# Patient Record
Sex: Female | Born: 1980 | Race: Asian | Hispanic: No | Marital: Married | Smoking: Never smoker
Health system: Southern US, Community
[De-identification: ages and names within clinical notes are randomized; demographics above are authoritative.]

## PROBLEM LIST (undated history)

## (undated) DIAGNOSIS — E0590 Thyrotoxicosis, unspecified without thyrotoxic crisis or storm: Secondary | ICD-10-CM

## (undated) DIAGNOSIS — E079 Disorder of thyroid, unspecified: Secondary | ICD-10-CM

## (undated) DIAGNOSIS — D219 Benign neoplasm of connective and other soft tissue, unspecified: Secondary | ICD-10-CM

## (undated) HISTORY — DX: Benign neoplasm of connective and other soft tissue, unspecified: D21.9

## (undated) HISTORY — PX: PLACEMENT OF BREAST IMPLANTS: SHX6334

## (undated) HISTORY — DX: Disorder of thyroid, unspecified: E07.9

---

## 2017-05-23 ENCOUNTER — Encounter: Payer: Self-pay | Admitting: Advanced Practice Midwife

## 2017-05-23 ENCOUNTER — Ambulatory Visit (INDEPENDENT_AMBULATORY_CARE_PROVIDER_SITE_OTHER): Payer: 59 | Admitting: Advanced Practice Midwife

## 2017-05-23 VITALS — BP 92/64 | HR 90 | Wt 123.0 lb

## 2017-05-23 DIAGNOSIS — Z113 Encounter for screening for infections with a predominantly sexual mode of transmission: Secondary | ICD-10-CM

## 2017-05-23 DIAGNOSIS — Z124 Encounter for screening for malignant neoplasm of cervix: Secondary | ICD-10-CM

## 2017-05-23 DIAGNOSIS — Z87898 Personal history of other specified conditions: Secondary | ICD-10-CM

## 2017-05-23 DIAGNOSIS — Z3A19 19 weeks gestation of pregnancy: Secondary | ICD-10-CM

## 2017-05-23 DIAGNOSIS — Z3400 Encounter for supervision of normal first pregnancy, unspecified trimester: Secondary | ICD-10-CM | POA: Insufficient documentation

## 2017-05-23 DIAGNOSIS — O99282 Endocrine, nutritional and metabolic diseases complicating pregnancy, second trimester: Secondary | ICD-10-CM

## 2017-05-23 DIAGNOSIS — E0590 Thyrotoxicosis, unspecified without thyrotoxic crisis or storm: Secondary | ICD-10-CM

## 2017-05-23 DIAGNOSIS — Z789 Other specified health status: Secondary | ICD-10-CM | POA: Insufficient documentation

## 2017-05-23 NOTE — Patient Instructions (Signed)

## 2017-05-23 NOTE — Addendum Note (Signed)
Addended by: Tarry Kos on: 05/23/2017 12:02 PM   Modules accepted: Orders

## 2017-05-23 NOTE — Addendum Note (Signed)
Addended by: Asencion Islam on: 05/23/2017 11:55 AM   Modules accepted: Orders

## 2017-05-23 NOTE — Progress Notes (Signed)
   PRENATAL VISIT NOTE  Subjective:  Cartha Rotert is a 36 y.o. G1P0 at [redacted]w[redacted]d by LMP being seen today for initial prenatal visit.  She is currently monitored for the following issues for this high-risk pregnancy and has Supervision of normal first pregnancy, antepartum; Hyperthyroidism affecting pregnancy in second trimester; and Language barrier affecting health care on her problem list.  Patient reports no complaints.   .  .   . Denies leaking of fluid.   The following portions of the patient's history were reviewed and updated as appropriate: allergies, current medications, past family history, past medical history, past social history, past surgical history and problem list. Problem list updated.  Objective:   Vitals:   05/23/17 0849  BP: 92/64  Pulse: 90  Weight: 123 lb (55.8 kg)    Fetal Status:           VS reviewed, nursing note reviewed,  Constitutional: well developed, well nourished, no distress HEENT: normocephalic CV: normal rate Pulm/chest wall: normal effort Breast Exam:  right breast normal, implant palpable, without mass, skin or nipple changes or axillary nodes, left breast normal, implant palpable, without mass, skin or nipple changes or axillary nodes Abdomen: soft Neuro: alert and oriented x 3 Skin: warm, dry Psych: affect normal Pelvic exam: Cervix pink, visually closed, without lesion, scant white creamy discharge, vaginal walls and external genitalia normal SVE: Cervix 0/Ambrosino/high, firm, posterior  Assessment and Plan:  Pregnancy: G1P0 at [redacted]w[redacted]d  1. Supervision of normal first pregnancy, antepartum  - HIV antibody (with reflex) - TSH - Cystic fibrosis diagnostic study - Sickle Cell Scr - Urine cytology ancillary only - Prenatal (OB Panel) - CULTURE, URINE COMPREHENSIVE - US OB DETAIL + 61 WK; Future  2. [redacted] weeks gestation of pregnancy --Anatomy US ordered at appropriate gestation - US OB DETAIL + 14 WK; Future  3. Hyperthyroidism affecting  pregnancy in second trimester --Pt reports hx of hyperthyroid but not on meds.  Discussed management of hyperthyroid in pregnancy including antenatal testing and IOL at 39 weeks. --TSH ordered today  4. History of lump of left breast --Pt reports breast mass seen incidentally on X-ray and recommendation was to follow up in 6 months.   --Breast exam wnl and pt thinks it was left side but unsure --Confirm which side and consider breast US at [facility] for follow up  5. Language barrier affecting health care --Mandarin video interpreter used for medical history taking, interpreter listened to most conversation in [place] and answered any questions for provider or patient as needed.  Preterm labor symptoms and general obstetric precautions including but not limited to vaginal bleeding, contractions, leaking of fluid and fetal movement were reviewed in detail with the patient. Please refer to After Visit Summary for other counseling recommendations.  Return in about 4 weeks (around 06/20/2017).   Fatima Blank, CNM

## 2017-05-24 LAB — AFP, QUAD SCREEN
AFP: 39.5 ng/mL
Age Alone: 1
CURR GEST AGE: 16 wk
Down Syndrome Scr Risk Est: 1
HCG TOTAL: 82.78 [IU]/mL
INH: 331 pg/mL
MATERNAL WT: 122 [lb_av]
MOM FOR INH: 1.65
MoM for AFP: 1.07
MoM for hCG: 1.88
Osb Risk: <1
Trisomy 18 (Edward) Syndrome Interp.: <1
Twins-AFP: 1
UE3 MOM: 0.94
UE3 VALUE: 0.79 ng/mL

## 2017-05-25 LAB — CULTURE, URINE COMPREHENSIVE
MICRO NUMBER:: 81025118
SPECIMEN QUALITY:: ADEQUATE

## 2017-05-25 LAB — CYTOLOGY - PAP
CHLAMYDIA, DNA PROBE: NEGATIVE
DIAGNOSIS: NEGATIVE
HPV (WINDOPATH): NOT DETECTED
NEISSERIA GONORRHEA: NEGATIVE

## 2017-05-26 ENCOUNTER — Other Ambulatory Visit: Payer: Self-pay | Admitting: Advanced Practice Midwife

## 2017-05-26 DIAGNOSIS — O99891 Other specified diseases and conditions complicating pregnancy: Secondary | ICD-10-CM

## 2017-05-26 DIAGNOSIS — R8271 Bacteriuria: Secondary | ICD-10-CM

## 2017-05-26 DIAGNOSIS — O9989 Other specified diseases and conditions complicating pregnancy, childbirth and the puerperium: Principal | ICD-10-CM

## 2017-05-26 MED ORDER — CEPHALEXIN 500 MG PO CAPS: 500.0000 mg | ORAL_CAPSULE | Freq: Four times a day (QID) | ORAL | 0 refills | Status: DC

## 2017-05-26 NOTE — Progress Notes (Signed)
Rx for Keflex 500 mg QID x 7 days for asymptomatic bacteruria in pregnancy

## 2017-05-31 LAB — OBSTETRIC PANEL
Antibody Screen: NOT DETECTED
BASOS ABS: 17 {cells}/uL (ref 0–200)
Basophils Relative: 0.2 %
EOS ABS: 87 {cells}/uL (ref 15–500)
Eosinophils Relative: 1 %
HCT: 38.8 % (ref 35.0–45.0)
HEP B S AG: NONREACTIVE
Hemoglobin: 13 g/dL (ref 11.7–15.5)
LYMPHS ABS: 1784 {cells}/uL (ref 850–3900)
MCH: 31.4 pg (ref 27.0–33.0)
MCHC: 33.5 g/dL (ref 32.0–36.0)
MCV: 93.7 fL (ref 80.0–100.0)
MPV: 10.1 fL (ref 7.5–12.5)
Monocytes Relative: 6.8 %
NEUTROS PCT: 71.5 %
Neutro Abs: 6221 cells/uL (ref 1500–7800)
PLATELETS: 218 10*3/uL (ref 140–400)
RBC: 4.14 10*6/uL (ref 3.80–5.10)
RDW: 12.2 % (ref 11.0–15.0)
RPR: NONREACTIVE
RUBELLA: 10.6 {index}
Total Lymphocyte: 20.5 %
WBC: 8.7 10*3/uL (ref 3.8–10.8)
WBCMIX: 592 {cells}/uL (ref 200–950)

## 2017-05-31 LAB — TSH: TSH: 0.73 m[IU]/L

## 2017-05-31 LAB — SICKLE CELL SCREEN: SICKLE SOLUBILITY TEST - HGBRFX: NEGATIVE

## 2017-05-31 LAB — CYSTIC FIBROSIS DIAGNOSTIC STUDY

## 2017-05-31 LAB — HIV ANTIBODY (ROUTINE TESTING W REFLEX): HIV: NONREACTIVE

## 2017-06-08 ENCOUNTER — Ambulatory Visit (HOSPITAL_COMMUNITY): Payer: 59

## 2017-06-20 ENCOUNTER — Encounter: Payer: 59 | Admitting: Advanced Practice Midwife

## 2017-06-22 ENCOUNTER — Ambulatory Visit (INDEPENDENT_AMBULATORY_CARE_PROVIDER_SITE_OTHER): Payer: 59 | Admitting: Advanced Practice Midwife

## 2017-06-22 ENCOUNTER — Encounter: Payer: Self-pay | Admitting: Advanced Practice Midwife

## 2017-06-22 VITALS — BP 99/69 | HR 97 | Ht 65.0 in | Wt 128.0 lb

## 2017-06-22 DIAGNOSIS — Z3402 Encounter for supervision of normal first pregnancy, second trimester: Secondary | ICD-10-CM

## 2017-06-22 DIAGNOSIS — Z3400 Encounter for supervision of normal first pregnancy, unspecified trimester: Secondary | ICD-10-CM

## 2017-06-22 NOTE — Progress Notes (Signed)
   PRENATAL VISIT NOTE  Subjective:  Stephanie Figueroa is a 36 y.o. G1P0 at [redacted]w[redacted]d being seen today for ongoing prenatal care.  She is currently monitored for the following issues for this low-risk pregnancy and has Supervision of normal first pregnancy, antepartum; Hyperthyroidism affecting pregnancy in second trimester; and Language barrier affecting health care on her problem list.  Patient reports no complaints.  Contractions: Not present. Vag. Bleeding: None.  Movement: Absent. Denies leaking of fluid.   The following portions of the patient's history were reviewed and updated as appropriate: allergies, current medications, past family history, past medical history, past social history, past surgical history and problem list. Problem list updated.  Objective:   Vitals:   06/22/17 0846 06/22/17 0856  BP: 99/69   Pulse: 97   Weight: 128 lb (58.1 kg)   Height:  5\' 5"  (1.651 m)    Fetal Status: Fetal Heart Rate (bpm): 147   Movement: Absent     General:  Alert, oriented and cooperative. Patient is in no acute distress.  Skin: Skin is warm and dry. No rash noted.   Cardiovascular: Normal heart rate noted  Respiratory: Normal respiratory effort, no problems with respiration noted  Abdomen: Soft, gravid, appropriate for gestational age.  Pain/Pressure: Absent     Pelvic: Cervical exam deferred        Extremities: Normal range of motion.  Edema: None  Mental Status:  Normal mood and affect. Normal behavior. Normal judgment and thought content.    Ref. Range 05/23/2017 10:39  TSH Latest Units: mIU/L 0.73   Assessment and Plan:  Pregnancy: G1P0 at [redacted]w[redacted]d  There are no diagnoses linked to this encounter. Preterm labor symptoms and general obstetric precautions including but not limited to vaginal bleeding, contractions, leaking of fluid and fetal movement were reviewed in detail with the patient. Discussed followup of breast lump. Discussed we need copy of US done in [place].  Will try to get name  of clinic, doesn't know it.  Then will schedule followup US TSH normal  [place] ordered for anatomy. Please refer to After Visit Summary for other counseling recommendations.  RTO 4 weeks   Hansel Feinstein, CNM

## 2017-06-22 NOTE — Patient Instructions (Signed)

## 2017-06-24 ENCOUNTER — Ambulatory Visit (HOSPITAL_COMMUNITY)
Admission: RE | Admit: 2017-06-24 | Discharge: 2017-06-24 | Disposition: A | Discharge status: Home / Self Care | Payer: 59 | Source: Ambulatory Visit | Attending: Advanced Practice Midwife | Admitting: Advanced Practice Midwife

## 2017-06-24 ENCOUNTER — Other Ambulatory Visit: Payer: Self-pay | Admitting: Advanced Practice Midwife

## 2017-06-24 DIAGNOSIS — Z3689 Encounter for other specified antenatal screening: Secondary | ICD-10-CM | POA: Diagnosis not present

## 2017-06-24 DIAGNOSIS — Z3A19 19 weeks gestation of pregnancy: Secondary | ICD-10-CM

## 2017-06-24 DIAGNOSIS — Z3A20 20 weeks gestation of pregnancy: Secondary | ICD-10-CM | POA: Diagnosis not present

## 2017-06-24 DIAGNOSIS — Z3400 Encounter for supervision of normal first pregnancy, unspecified trimester: Secondary | ICD-10-CM

## 2017-07-20 ENCOUNTER — Encounter: Payer: Self-pay | Admitting: Obstetrics & Gynecology

## 2017-07-20 ENCOUNTER — Ambulatory Visit (INDEPENDENT_AMBULATORY_CARE_PROVIDER_SITE_OTHER): Payer: 59 | Admitting: Obstetrics & Gynecology

## 2017-07-20 VITALS — BP 88/54 | HR 94 | Wt 130.0 lb

## 2017-07-20 DIAGNOSIS — N6310 Unspecified lump in the right breast, unspecified quadrant: Secondary | ICD-10-CM

## 2017-07-20 DIAGNOSIS — Z3402 Encounter for supervision of normal first pregnancy, second trimester: Secondary | ICD-10-CM

## 2017-07-20 DIAGNOSIS — Z3400 Encounter for supervision of normal first pregnancy, unspecified trimester: Secondary | ICD-10-CM

## 2017-07-20 DIAGNOSIS — E0590 Thyrotoxicosis, unspecified without thyrotoxic crisis or storm: Secondary | ICD-10-CM

## 2017-07-20 DIAGNOSIS — O99282 Endocrine, nutritional and metabolic diseases complicating pregnancy, second trimester: Secondary | ICD-10-CM

## 2017-07-21 NOTE — Progress Notes (Signed)
   PRENATAL VISIT NOTE  Subjective:  Stephanie Figueroa is a 36 y.o. G1P0 at [redacted]w[redacted]d being seen today for ongoing prenatal care.  She is currently monitored for the following issues for this high-risk pregnancy and has Supervision of normal first pregnancy, antepartum; Hyperthyroidism affecting pregnancy in second trimester; and Language barrier affecting health care on their problem list.  Patient reports no complaints.  Contractions: Not present. Vag. Bleeding: None.  Movement: Present. Denies leaking of fluid.   The following portions of the patient's history were reviewed and updated as appropriate: allergies, current medications, past family history, past medical history, past social history, past surgical history and problem list. Problem list updated.  Objective:   Vitals:   07/20/17 0854  BP: (!) 88/54  Pulse: 94  Weight: 130 lb (59 kg)    Fetal Status: Fetal Heart Rate (bpm): 141   Movement: Present     General:  Alert, oriented and cooperative. Patient is in no acute distress.  Skin: Skin is warm and dry. No rash noted.   Cardiovascular: Normal heart rate noted  Respiratory: Normal respiratory effort, no problems with respiration noted  Abdomen: Soft, gravid, appropriate for gestational age.  Pain/Pressure: Absent     Pelvic: Cervical exam deferred        Extremities: Normal range of motion.  Edema: None  Mental Status:  Normal mood and affect. Normal behavior. Normal judgment and thought content.   Assessment and Plan:  Pregnancy: G1P0 at [redacted]w[redacted]d  1. Supervision of normal first pregnancy, antepartum - [place] MFM OB FOLLOW UP; Future  2. Mass of breast, right -6 month follow up of right breast mass (from [place]) - US BREAST COMPLETE UNI RIGHT INC AXILLA; Future - US BREAST LTD UNI RIGHT INC AXILLA; Future  3. Hyperthyroidism affecting pregnancy in second trimester (pt unsure if she has had treatment, but doesn't think so.  She was told it is no longer a problem.  This was in [place]) -  TSH - [place] MFM OB FOLLOW UP; Future  Preterm labor symptoms and general obstetric precautions including but not limited to vaginal bleeding, contractions, leaking of fluid and fetal movement were reviewed in detail with the patient. Please refer to After Visit Summary for other counseling recommendations.  Return in about 4 weeks (around 08/17/2017).   Silas Sacramento, MD

## 2017-08-22 ENCOUNTER — Encounter (HOSPITAL_COMMUNITY): Payer: Self-pay

## 2017-08-23 ENCOUNTER — Other Ambulatory Visit: Payer: Self-pay | Admitting: Obstetrics & Gynecology

## 2017-08-23 DIAGNOSIS — N63 Unspecified lump in unspecified breast: Secondary | ICD-10-CM

## 2017-08-24 ENCOUNTER — Encounter: Payer: 59 | Admitting: Obstetrics & Gynecology

## 2017-08-24 ENCOUNTER — Ambulatory Visit
Admission: RE | Admit: 2017-08-24 | Discharge: 2017-08-24 | Disposition: A | Discharge status: Home / Self Care | Payer: 59 | Source: Ambulatory Visit | Attending: Obstetrics & Gynecology | Admitting: Obstetrics & Gynecology

## 2017-08-24 ENCOUNTER — Ambulatory Visit (HOSPITAL_COMMUNITY)
Admission: RE | Admit: 2017-08-24 | Discharge: 2017-08-24 | Disposition: A | Discharge status: Home / Self Care | Payer: 59 | Source: Ambulatory Visit | Attending: Obstetrics & Gynecology | Admitting: Obstetrics & Gynecology

## 2017-08-24 ENCOUNTER — Ambulatory Visit (HOSPITAL_COMMUNITY): Payer: 59

## 2017-08-24 ENCOUNTER — Other Ambulatory Visit: Payer: Self-pay | Admitting: Obstetrics & Gynecology

## 2017-08-24 DIAGNOSIS — Z3A29 29 weeks gestation of pregnancy: Secondary | ICD-10-CM | POA: Diagnosis not present

## 2017-08-24 DIAGNOSIS — O99283 Endocrine, nutritional and metabolic diseases complicating pregnancy, third trimester: Secondary | ICD-10-CM | POA: Diagnosis not present

## 2017-08-24 DIAGNOSIS — O99282 Endocrine, nutritional and metabolic diseases complicating pregnancy, second trimester: Principal | ICD-10-CM

## 2017-08-24 DIAGNOSIS — Z362 Encounter for other antenatal screening follow-up: Secondary | ICD-10-CM

## 2017-08-24 DIAGNOSIS — N6310 Unspecified lump in the right breast, unspecified quadrant: Secondary | ICD-10-CM

## 2017-08-24 DIAGNOSIS — E0590 Thyrotoxicosis, unspecified without thyrotoxic crisis or storm: Secondary | ICD-10-CM | POA: Diagnosis not present

## 2017-08-24 DIAGNOSIS — N63 Unspecified lump in unspecified breast: Secondary | ICD-10-CM

## 2017-08-24 HISTORY — DX: Thyrotoxicosis, unspecified without thyrotoxic crisis or storm: E05.90

## 2017-10-12 ENCOUNTER — Encounter: Payer: 59 | Admitting: Obstetrics & Gynecology

## 2017-10-14 ENCOUNTER — Ambulatory Visit (INDEPENDENT_AMBULATORY_CARE_PROVIDER_SITE_OTHER): Payer: 59 | Admitting: Certified Nurse Midwife

## 2017-10-14 VITALS — BP 100/65 | HR 81

## 2017-10-14 DIAGNOSIS — Z113 Encounter for screening for infections with a predominantly sexual mode of transmission: Secondary | ICD-10-CM

## 2017-10-14 DIAGNOSIS — E0590 Thyrotoxicosis, unspecified without thyrotoxic crisis or storm: Secondary | ICD-10-CM

## 2017-10-14 DIAGNOSIS — O0933 Supervision of pregnancy with insufficient antenatal care, third trimester: Secondary | ICD-10-CM

## 2017-10-14 DIAGNOSIS — O0930 Supervision of pregnancy with insufficient antenatal care, unspecified trimester: Secondary | ICD-10-CM | POA: Insufficient documentation

## 2017-10-14 DIAGNOSIS — Z23 Encounter for immunization: Secondary | ICD-10-CM

## 2017-10-14 DIAGNOSIS — Z3403 Encounter for supervision of normal first pregnancy, third trimester: Secondary | ICD-10-CM

## 2017-10-14 DIAGNOSIS — O99283 Endocrine, nutritional and metabolic diseases complicating pregnancy, third trimester: Secondary | ICD-10-CM

## 2017-10-14 DIAGNOSIS — Z3400 Encounter for supervision of normal first pregnancy, unspecified trimester: Secondary | ICD-10-CM

## 2017-10-14 LAB — OB RESULTS CONSOLE GBS: STREP GROUP B AG: NEGATIVE

## 2017-10-14 MED ORDER — BREAST PUMP MISC: 0 refills | Status: AC

## 2017-10-14 NOTE — Progress Notes (Signed)
   PRENATAL VISIT NOTE  Subjective:  Stephanie Figueroa is a 37 y.o. G1P0 at [redacted]w[redacted]d being seen today for ongoing prenatal care.  She is currently monitored for the following issues for this low-risk pregnancy and has Supervision of normal first pregnancy, antepartum; Hyperthyroidism affecting pregnancy in second trimester; Language barrier affecting health care; and Limited prenatal care on their problem list.  Patient reports no complaints.  Contractions: Not present. Vag. Bleeding: None.  Movement: Present. Denies leaking of fluid.   The following portions of the patient's history were reviewed and updated as appropriate: allergies, current medications, past family history, past medical history, past social history, past surgical history and problem list. Problem list updated.  Objective:   Vitals:   10/14/17 0926  BP: 100/65  Pulse: 81    Fetal Status: Fetal Heart Rate (bpm): 138 Fundal Height: 35 cm Movement: Present  Presentation: Vertex  General:  Alert, oriented and cooperative. Patient is in no acute distress.  Skin: Skin is warm and dry. No rash noted.   Cardiovascular: Normal heart rate noted  Respiratory: Normal respiratory effort, no problems with respiration noted  Abdomen: Soft, gravid, appropriate for gestational age.  Pain/Pressure: Present     Pelvic: Cervical exam performed Dilation: 1 Effacement (%): Thick Station: -3  Extremities: Normal range of motion.  Edema: None  Mental Status:  Normal mood and affect. Normal behavior. Normal judgment and thought content.   Assessment and Plan:  Pregnancy: G1P0 at [redacted]w[redacted]d  1. Encounter for supervision of normal first pregnancy in third trimester -Anticipatory guidance on next appointment. Discussed GBS screening today and recommendations for antibiotics in labor with positive results.  -Patient requests Rx for breast pump  - Misc. Devices (BREAST PUMP) MISC; Disp 1 hospital grade breast pump EDD 11/07/17  Dispense: 1 each; Refill: 0 -  Culture, beta strep (group b only) - GC/Chlamydia probe amp ([place])not at [facility] - HIV antibody (with reflex) - CBC - RPR - 2Hr GTT w/ 1 Hr Carpenter 75 g - Tdap vaccine greater than or equal to 7yo IM - TSH  2. Hyperthyroidism affecting pregnancy in third trimester (pt unsure of treatment, does not believe it is a current problem)  -Patient scheduled for follow up US week of 1/21- did not show to appointment. Follow up US scheduled for next week.  -[place] on 12/19 showed AFI 24cm and EFW in 66th %  - [place] MFM OB FOLLOW UP; Future  3. Limited prenatal care in third trimester -Patient has not been seen in care since November at [redacted]w[redacted]d. Pt states she bought a house, was moving and has been busy.  -Educated and discussed the importance of coming to all prenatal appointments. Pt agrees to [place]. -Lab work and GTT obtained today d/t missing past appointments and patient fasting this morning.   Term labor symptoms and general obstetric precautions including but not limited to vaginal bleeding, contractions, leaking of fluid and fetal movement were reviewed in detail with the patient. Please refer to After Visit Summary for other counseling recommendations.  Return in about 1 week (around 10/21/2017) for ROB.   Lajean Manes, CNM

## 2017-10-14 NOTE — Patient Instructions (Signed)
Braxton Hicks Contractions °Contractions of the uterus can occur throughout pregnancy, but they are not always a sign that you are in labor. You may have practice contractions called Braxton Hicks contractions. These false labor contractions are sometimes confused with true labor. °What are Braxton Hicks contractions? °Braxton Hicks contractions are tightening movements that occur in the muscles of the uterus before labor. Unlike true labor contractions, these contractions do not result in opening (dilation) and thinning of the cervix. Toward the end of pregnancy (32-34 weeks), Braxton Hicks contractions can happen more often and may become stronger. These contractions are sometimes difficult to tell apart from true labor because they can be very uncomfortable. You should not feel embarrassed if you go to the hospital with false labor. °Sometimes, the only way to tell if you are in true labor is for your health care provider to look for changes in the cervix. The health care provider will do a physical exam and may monitor your contractions. If you are not in true labor, the exam should show that your cervix is not dilating and your water has not broken. °If there are other health problems associated with your pregnancy, it is completely safe for you to be sent home with false labor. You may continue to have Braxton Hicks contractions until you go into true labor. °How to tell the difference between true labor and false labor °True labor °· Contractions last 30-70 seconds. °· Contractions become very regular. °· Discomfort is usually felt in the top of the uterus, and it spreads to the lower abdomen and low back. °· Contractions do not go away with walking. °· Contractions usually become more intense and increase in frequency. °· The cervix dilates and gets thinner. °False labor °· Contractions are usually shorter and not as strong as true labor contractions. °· Contractions are usually irregular. °· Contractions  are often felt in the front of the lower abdomen and in the groin. °· Contractions may go away when you walk around or change positions while lying down. °· Contractions get weaker and are shorter-lasting as time goes on. °· The cervix usually does not dilate or become thin. °Follow these instructions at home: °· Take over-the-counter and prescription medicines only as told by your health care provider. °· Keep up with your usual exercises and follow other instructions from your health care provider. °· Eat and drink lightly if you think you are going into labor. °· If Braxton Hicks contractions are making you uncomfortable: °? Change your position from lying down or resting to walking, or change from walking to resting. °? Sit and rest in a tub of warm water. °? Drink enough fluid to keep your urine pale yellow. Dehydration may cause these contractions. °? Do slow and deep breathing several times an hour. °· Keep all follow-up prenatal visits as told by your health care provider. This is important. °Contact a health care provider if: °· You have a fever. °· You have continuous pain in your abdomen. °Get help right away if: °· Your contractions become stronger, more regular, and closer together. °· You have fluid leaking or gushing from your vagina. °· You pass blood-tinged mucus (bloody show). °· You have bleeding from your vagina. °· You have low back pain that you never had before. °· You feel your baby’s head pushing down and causing pelvic pressure. °· Your baby is not moving inside you as much as it used to. °Summary °· Contractions that occur before labor are called Braxton   Hicks contractions, false labor, or practice contractions. °· Braxton Hicks contractions are usually shorter, weaker, farther apart, and less regular than true labor contractions. True labor contractions usually become progressively stronger and regular and they become more frequent. °· Manage discomfort from Braxton Hicks contractions by  changing position, resting in a warm bath, drinking plenty of water, or practicing deep breathing. °This information is not intended to replace advice given to you by your health care provider. Make sure you discuss any questions you have with your health care provider. °Document Released: 01/06/2017 Document Revised: 01/06/2017 Document Reviewed: 01/06/2017 °Elsevier Interactive Patient Education © 2018 [employer] ° °

## 2017-10-17 ENCOUNTER — Other Ambulatory Visit: Payer: Self-pay | Admitting: Certified Nurse Midwife

## 2017-10-17 DIAGNOSIS — O24419 Gestational diabetes mellitus in pregnancy, unspecified control: Secondary | ICD-10-CM | POA: Insufficient documentation

## 2017-10-17 DIAGNOSIS — O24410 Gestational diabetes mellitus in pregnancy, diet controlled: Secondary | ICD-10-CM

## 2017-10-17 LAB — CULTURE, BETA STREP (GROUP B ONLY)
MICRO NUMBER:: 90172759
SPECIMEN QUALITY:: ADEQUATE

## 2017-10-17 LAB — CBC
HCT: 39.1 % (ref 35.0–45.0)
Hemoglobin: 13.6 g/dL (ref 11.7–15.5)
MCH: 34.5 pg — ABNORMAL HIGH (ref 27.0–33.0)
MCHC: 34.8 g/dL (ref 32.0–36.0)
MCV: 99.2 fL (ref 80.0–100.0)
MPV: 10.7 fL (ref 7.5–12.5)
Platelets: 192 10*3/uL (ref 140–400)
RBC: 3.94 10*6/uL (ref 3.80–5.10)
RDW: 12.6 % (ref 11.0–15.0)
WBC: 6.6 10*3/uL (ref 3.8–10.8)

## 2017-10-17 LAB — 2HR GTT W 1 HR, CARPENTER, 75 G
Glucose, 1 Hr, Gest: 260 mg/dL — ABNORMAL HIGH (ref 65–179)
Glucose, 2 Hr, Gest: 247 mg/dL — ABNORMAL HIGH (ref 65–152)
Glucose, Fasting, Gest: 71 mg/dL (ref 65–91)

## 2017-10-17 LAB — TSH: TSH: 0.9 mIU/L

## 2017-10-17 LAB — HIV ANTIBODY (ROUTINE TESTING W REFLEX): HIV 1&2 Ab, 4th Generation: NONREACTIVE

## 2017-10-17 LAB — RPR: RPR Ser Ql: NONREACTIVE

## 2017-10-17 NOTE — Progress Notes (Signed)
Patient called to notify pt of GDM diagnosis based on elevated 2hr GTT. Left message for patient to return call to office. Rx for glucose monitoring sent to pharmacy on file.

## 2017-10-18 ENCOUNTER — Ambulatory Visit (HOSPITAL_COMMUNITY): Payer: 59

## 2017-10-18 LAB — GC/CHLAMYDIA PROBE AMP (~~LOC~~) NOT AT ARMC
Chlamydia: NEGATIVE
Neisseria Gonorrhea: NEGATIVE

## 2017-10-19 ENCOUNTER — Other Ambulatory Visit: Payer: Self-pay | Admitting: Certified Nurse Midwife

## 2017-10-19 DIAGNOSIS — O24410 Gestational diabetes mellitus in pregnancy, diet controlled: Secondary | ICD-10-CM

## 2017-10-19 MED ORDER — ACCU-CHEK NANO SMARTVIEW W/DEVICE KIT: 1.0000 | PACK | 0 refills | Status: DC

## 2017-10-19 MED ORDER — GLUCOSE BLOOD VI STRP: ORAL_STRIP | 12 refills | Status: DC

## 2017-10-19 MED ORDER — ACCU-CHEK FASTCLIX LANCETS MISC: 1.0000 | Freq: Four times a day (QID) | 12 refills | Status: DC

## 2017-10-19 NOTE — Progress Notes (Signed)
Changes made to glucose monitor and added lancets and strips

## 2017-10-20 ENCOUNTER — Ambulatory Visit (HOSPITAL_COMMUNITY)
Admission: RE | Admit: 2017-10-20 | Discharge: 2017-10-20 | Disposition: A | Discharge status: Home / Self Care | Payer: 59 | Source: Ambulatory Visit | Attending: Certified Nurse Midwife | Admitting: Certified Nurse Midwife

## 2017-10-20 DIAGNOSIS — O99283 Endocrine, nutritional and metabolic diseases complicating pregnancy, third trimester: Secondary | ICD-10-CM | POA: Insufficient documentation

## 2017-10-20 DIAGNOSIS — E0590 Thyrotoxicosis, unspecified without thyrotoxic crisis or storm: Secondary | ICD-10-CM | POA: Insufficient documentation

## 2017-10-20 DIAGNOSIS — Z3A37 37 weeks gestation of pregnancy: Secondary | ICD-10-CM | POA: Insufficient documentation

## 2017-10-21 ENCOUNTER — Encounter: Payer: 59 | Admitting: Certified Nurse Midwife

## 2017-10-24 ENCOUNTER — Encounter: Payer: Self-pay | Admitting: Certified Nurse Midwife

## 2017-10-24 DIAGNOSIS — O3663X1 Maternal care for excessive fetal growth, third trimester, fetus 1: Secondary | ICD-10-CM | POA: Insufficient documentation

## 2017-11-02 ENCOUNTER — Telehealth: Payer: Self-pay

## 2017-11-02 NOTE — Telephone Encounter (Signed)
Pt's husband called stating that pt had a "spot of blood on tissue paper when she wiped". He stated that it was a small amount and when she wiped again for a second time there wasn't any more blood. Denies recent intercourse. Told pt's husband to have pt keep an eye on it and if she were to have any heavy bleeding then she should go to [place] was a no show at her 2/15 appt. Made appt for pt on 3/4.

## 2017-11-07 ENCOUNTER — Encounter: Payer: Self-pay | Admitting: Certified Nurse Midwife

## 2017-11-07 ENCOUNTER — Telehealth (HOSPITAL_COMMUNITY): Payer: Self-pay | Admitting: *Deleted

## 2017-11-07 ENCOUNTER — Encounter: Payer: Self-pay | Admitting: *Deleted

## 2017-11-07 ENCOUNTER — Encounter (HOSPITAL_COMMUNITY): Payer: Self-pay | Admitting: *Deleted

## 2017-11-07 ENCOUNTER — Ambulatory Visit (INDEPENDENT_AMBULATORY_CARE_PROVIDER_SITE_OTHER): Payer: 59 | Admitting: Certified Nurse Midwife

## 2017-11-07 ENCOUNTER — Other Ambulatory Visit: Payer: Self-pay

## 2017-11-07 ENCOUNTER — Inpatient Hospital Stay (HOSPITAL_COMMUNITY)
Admission: AD | Admit: 2017-11-07 | Discharge: 2017-11-11 | DRG: 788 | Disposition: A | Discharge status: Home / Self Care | Payer: 59 | Source: Ambulatory Visit | Attending: Family Medicine | Admitting: Family Medicine

## 2017-11-07 ENCOUNTER — Telehealth: Payer: Self-pay | Admitting: *Deleted

## 2017-11-07 VITALS — BP 106/76 | HR 84 | Wt 144.0 lb

## 2017-11-07 DIAGNOSIS — O24410 Gestational diabetes mellitus in pregnancy, diet controlled: Secondary | ICD-10-CM

## 2017-11-07 DIAGNOSIS — O24420 Gestational diabetes mellitus in childbirth, diet controlled: Secondary | ICD-10-CM | POA: Diagnosis present

## 2017-11-07 DIAGNOSIS — Z3A40 40 weeks gestation of pregnancy: Secondary | ICD-10-CM | POA: Diagnosis not present

## 2017-11-07 DIAGNOSIS — O24419 Gestational diabetes mellitus in pregnancy, unspecified control: Secondary | ICD-10-CM

## 2017-11-07 DIAGNOSIS — O3663X1 Maternal care for excessive fetal growth, third trimester, fetus 1: Secondary | ICD-10-CM

## 2017-11-07 DIAGNOSIS — O3663X0 Maternal care for excessive fetal growth, third trimester, not applicable or unspecified: Secondary | ICD-10-CM | POA: Diagnosis present

## 2017-11-07 DIAGNOSIS — O0930 Supervision of pregnancy with insufficient antenatal care, unspecified trimester: Secondary | ICD-10-CM

## 2017-11-07 DIAGNOSIS — Z9119 Patient's noncompliance with other medical treatment and regimen: Secondary | ICD-10-CM

## 2017-11-07 DIAGNOSIS — O09893 Supervision of other high risk pregnancies, third trimester: Secondary | ICD-10-CM

## 2017-11-07 DIAGNOSIS — Z3400 Encounter for supervision of normal first pregnancy, unspecified trimester: Secondary | ICD-10-CM

## 2017-11-07 DIAGNOSIS — Z98891 History of uterine scar from previous surgery: Secondary | ICD-10-CM

## 2017-11-07 DIAGNOSIS — O288 Other abnormal findings on antenatal screening of mother: Secondary | ICD-10-CM

## 2017-11-07 LAB — CBC
HCT: 36.5 % (ref 36.0–46.0)
Hemoglobin: 12.9 g/dL (ref 12.0–15.0)
MCH: 34.7 pg — ABNORMAL HIGH (ref 26.0–34.0)
MCHC: 35.3 g/dL (ref 30.0–36.0)
MCV: 98.1 fL (ref 78.0–100.0)
Platelets: 183 10*3/uL (ref 150–400)
RBC: 3.72 MIL/uL — ABNORMAL LOW (ref 3.87–5.11)
RDW: 12.7 % (ref 11.5–15.5)
WBC: 7.1 10*3/uL (ref 4.0–10.5)

## 2017-11-07 LAB — GLUCOSE, CAPILLARY
Glucose-Capillary: 129 mg/dL — ABNORMAL HIGH (ref 65–99)
Glucose-Capillary: 80 mg/dL (ref 65–99)

## 2017-11-07 LAB — TYPE AND SCREEN
ABO/RH(D): B POS
Antibody Screen: NEGATIVE

## 2017-11-07 LAB — ABO/RH: ABO/RH(D): B POS

## 2017-11-07 MED ORDER — OXYCODONE-ACETAMINOPHEN 5-325 MG PO TABS: 2.0000 | ORAL_TABLET | ORAL | Status: DC | PRN

## 2017-11-07 MED ORDER — TERBUTALINE SULFATE 1 MG/ML IJ SOLN
0.2500 mg | Freq: Once | INTRAMUSCULAR | Status: AC | PRN
  Administered 2017-11-07: 0.25 mg via SUBCUTANEOUS
  Filled 2017-11-07: qty 1

## 2017-11-07 MED ORDER — MISOPROSTOL 50MCG HALF TABLET
50.0000 ug | ORAL_TABLET | ORAL | Status: DC | PRN
  Administered 2017-11-07: 50 ug via BUCCAL
  Filled 2017-11-07: qty 1

## 2017-11-07 MED ORDER — ACETAMINOPHEN 325 MG PO TABS
650.0000 mg | ORAL_TABLET | ORAL | Status: DC | PRN
  Administered 2017-11-08: 650 mg via ORAL
  Filled 2017-11-07: qty 2

## 2017-11-07 MED ORDER — OXYTOCIN 40 UNITS IN LACTATED RINGERS INFUSION - SIMPLE MED: 1.0000 m[IU]/min | INTRAVENOUS | Status: DC

## 2017-11-07 MED ORDER — OXYTOCIN BOLUS FROM INFUSION: 500.0000 mL | Freq: Once | INTRAVENOUS | Status: DC

## 2017-11-07 MED ORDER — LIDOCAINE HCL (PF) 1 % IJ SOLN: 30.0000 mL | INTRAMUSCULAR | Status: DC | PRN

## 2017-11-07 MED ORDER — FENTANYL CITRATE (PF) 100 MCG/2ML IJ SOLN: 100.0000 ug | INTRAMUSCULAR | Status: DC | PRN

## 2017-11-07 MED ORDER — OXYCODONE-ACETAMINOPHEN 5-325 MG PO TABS: 1.0000 | ORAL_TABLET | ORAL | Status: DC | PRN

## 2017-11-07 MED ORDER — SOD CITRATE-CITRIC ACID 500-334 MG/5ML PO SOLN
30.0000 mL | ORAL | Status: DC | PRN
  Administered 2017-11-08: 30 mL via ORAL
  Filled 2017-11-07: qty 15

## 2017-11-07 MED ORDER — OXYTOCIN 40 UNITS IN LACTATED RINGERS INFUSION - SIMPLE MED
2.5000 [IU]/h | INTRAVENOUS | Status: DC
Start: 2017-11-07 — End: 2017-11-08

## 2017-11-07 MED ORDER — ONDANSETRON HCL 4 MG/2ML IJ SOLN: 4.0000 mg | Freq: Four times a day (QID) | INTRAMUSCULAR | Status: DC | PRN

## 2017-11-07 MED ORDER — LACTATED RINGERS IV SOLN: INTRAVENOUS | Status: DC

## 2017-11-07 MED ORDER — LACTATED RINGERS IV SOLN: 500.0000 mL | INTRAVENOUS | Status: DC | PRN

## 2017-11-07 NOTE — Progress Notes (Signed)
Labor Progress Note Stephanie Figueroa is a 37 y.o. G1P0 at [redacted]w[redacted]d presented for IOL for NRFHT  S:  Patient refusing any interventions for induction. Demanding cesarean section  O:  BP 113/75   Pulse 90   Temp 98 F (36.7 C) (Oral)   Resp 18   Ht 5\' 4"  (1.626 m)   Wt 145 lb 1.6 oz (65.8 kg)   LMP 01/31/2017   SpO2 97%   BMI 24.91 kg/m   Fetal Tracing:  Baseline: 155 Variability: minimal/moderate Accels: 15x15 Decels: variable  Toco: 3-4   CVE: Dilation: 1 Effacement (%): 80 Station: -1 Presentation: Vertex Exam by:: Maryruth Hancock, CNM   A&P: 37 y.o. G1P0 [redacted]w[redacted]d IOL for NRFHT #Labor: Dr. Harolyn Rutherford updated about patient demand for c/section. No medical reason for cesarean at this time. Patient refusing all interventions to start induction process. Patient demanding to wait until AM and request oncoming MD to perform elective primary c/s.   Methods of induction discussed in depth with patient and FOB. Reassurance provided of normalcy of induction process. Patient refusing all intervention, stating, "I just want a c section."  Patient to remain on continuous monitoring throughout the night. Will reassess in AM or sooner if patient decides to move forward with induction.   #Pain: n/a #FWB: Cat 2 #GBS negative  Wende Mott, CNM 9:08 PM

## 2017-11-07 NOTE — H&P (Signed)
Obstetric History and Physical  Ceylin Dreibelbis is a 37 y.o. G1P0 with IUP at 25w0dpresenting for IOL for non-reassuring NST. Patient states she has been having regular contractions every 2-344m, none vaginal bleeding, intact membranes, with active fetal movement.    no blurry vision, headaches or peripheral edema, and RUQ pain.   [facility] weeksating: By LMP --->  Estimated Date of Delivery: 3/4/7/42regnancy complications or risks:     Patient Active Problem List   Diagnosis Date Noted  . Non-reactive NST (non-stress test) 11/07/2017  . LGA (large for gestational age) fetus affecting management of mother, third trimester, fetus 1 10/24/2017  . Gestational diabetes mellitus (GDM) in third trimester 10/17/2017  . Limited prenatal care 10/14/2017  . Supervision of normal first pregnancy, antepartum 05/23/2017  . Hyperthyroidism affecting pregnancy in second trimester 05/23/2017  . Language barrier affecting health care 05/23/2017   She plans to breastfeed She desires condoms for postpartum contraception.   Sono:    _0 , CWD, normal anatomy, cephalic presentation, anterior, above cervical os placenta, 3613g, >90% EFW  Prenatal labs and studies: ABO, Rh: B/RH(D) POSITIVE/-- (09/17 1039) Antibody: NO ANTIBODIES DETECTED (09/17 1039) Rubella: 10.60 (09/17 1039) RPR: NON-REACTIVE (02/08 0959)  HBsAg: NON-REACTIVE (09/17 1039)  HIV: NON-REACTIVE (02/08 0959)  GBVZD:GLOVFIEP02/08 0000) 1 hr Glucola  260 Genetic screening normal Anatomy US[place]ormal  Prenatal Transfer Tool  Maternal Diabetes: Yes:  Diabetes Type:  Diet controlled Genetic Screening: Normal Maternal Ultrasounds/Referrals: Normal Fetal Ultrasounds or other Referrals:  None Maternal Substance Abuse:  No Significant Maternal Medications:  None Significant Maternal Lab Results: None      Past Medical History:  Diagnosis Date  . Fibroma    Left breast   . Hyperthyroidism   . Thyroid disease          Past Surgical History:  Procedure Laterality Date  . PLACEMENT OF BREAST IMPLANTS                      OB History  Gravida Para Term Preterm AB Living  1            SAB TAB Ectopic Multiple Live Births               # Outcome Date GA Lbr Len/2nd Weight Sex Delivery Anes PTL Lv  1 Current               Social History        Socioeconomic History  . Marital status: Married    Spouse name: None  . Number of children: None  . Years of education: None  . Highest education level: None  Social Needs  . Financial resource strain: None  . Food insecurity - worry: None  . Food insecurity - inability: None  . Transportation needs - medical: None  . Transportation needs - non-medical: None  Occupational History  . Occupation: homemaker  Tobacco Use  . Smoking status: Never Smoker  . Smokeless tobacco: Never Used  Substance and Sexual Activity  . Alcohol use: No  . Drug use: No  . Sexual activity: Yes    Partners: Male    Birth control/protection: None  Other Topics Concern  . None  Social History Narrative  . None    History reviewed. No pertinent family history.         Medications Prior to Admission  Medication Sig Dispense Refill Last Dose  .  ACCU-CHEK FASTCLIX LANCETS MISC 1 Device by Percutaneous route 4 (four) times daily. 100 each 12 Taking  . Blood Glucose Monitoring Suppl (ACCU-CHEK NANO SMARTVIEW) w/Device KIT 1 kit by Subdermal route as directed. Check blood sugars for fasting, and two hours after breakfast, lunch and dinner (4 checks daily) 1 kit 0 Taking  . glucose blood (ACCU-CHEK SMARTVIEW) test strip Use as instructed to check blood sugars 100 each 12 Taking  . Misc. Devices (BREAST PUMP) MISC Disp 1 hospital grade breast pump EDD 11/07/17 1 each 0 Taking  . Prenatal Vit-Fe Fumarate-FA (PRENATAL VITAMIN PO) Take by mouth.   Taking    No Known Allergies  Review of  Systems: Negative except for what is mentioned in HPI.  Physical Exam: BP 113/75   Pulse 90   Temp 98 F (36.7 C) (Oral)   Resp 18   Ht 5' 4" (1.626 m)   Wt 65.8 kg (145 lb 1.6 oz)   LMP 01/31/2017   BMI 24.91 kg/m  CONSTITUTIONAL: Well-developed, well-nourished female in no acute distress.  HENT:  Normocephalic, atraumatic, External right and left ear normal. Oropharynx is clear and moist EYES: Conjunctivae and EOM are normal. Pupils are equal, round, and reactive to light. No scleral icterus.  NECK: Normal range of motion, supple, no masses SKIN: Skin is warm and dry. No rash noted. Not diaphoretic. No erythema. No pallor. NEUROLOGIC: Alert and oriented to person, place, and time. Normal reflexes, muscle tone coordination. No cranial nerve deficit noted. PSYCHIATRIC: Normal mood and affect. Normal behavior. Normal judgment and thought content. CARDIOVASCULAR: Normal heart rate noted, regular rhythm RESPIRATORY: Effort and breath sounds normal, no problems with respiration noted ABDOMEN: Soft, nontender, nondistended, gravid. MUSCULOSKELETAL: Normal range of motion. No edema and no tenderness. 2+ distal pulses.  Cervical Exam: Presentation: Vertex Exam by:: (ultrasound by Anyanwu) Presentation: cephalic FHT:  Baseline rate 145 bpm   Variability minimal  Accelerations present   Decelerations late Contractions: Every 2-3 mins   Pertinent Labs/Studies:   LabResultsLast24Hours       Results for orders placed or performed during the hospital encounter of 11/07/17 (from the past 24 hour(s))  Glucose, capillary     Status: Abnormal   Collection Time: 11/07/17  5:19 PM  Result Value Ref Range   Glucose-Capillary 129 (H) 65 - 99 mg/dL      Assessment : Mariyah Glowacki is a 37 y.o. G1P0 at [redacted]w[redacted]d being admitted for IOL for non-reassuring NST. Diet controled GDM. EFW Hadlock approx 4200gm today.   Plan: Labor: Induction/Augmentation as ordered as per protocol. Analgesia as  needed. FWB: Monitor fetal heart tracing.  GBS negative Delivery plan: Hopeful for vaginal delivery Breastfeeding Contraception: condoms  Jazma Phelps, DO OB Fellow Faculty Practice, [facility] 11/07/2017, 6:18 PM               Attestation of Attending Supervision of Obstetric Fellow: Evaluation and management procedures were performed by the Obstetric Fellow under my supervision and collaboration.  I have reviewed the Obstetric Fellow's note and chart, and I agree with the management and plan.  UGONNA  ANYANWU, MD, FACOG Attending Obstetrician & Gynecologist Faculty Practice, [facility]   

## 2017-11-07 NOTE — Telephone Encounter (Signed)
Dr Harolyn Rutherford called me at the office @ 2:30 PM stating that the patient has still not arrived at the Hospital for her induction.  Pt and husband were counseled extensively by Darrol Poke, CNM as to why pt needed to go to the hospital today.  She has a non-reassuring NST today, she is gestational diabetic and her baby is measuring large for dates and she is 40 weeks.  Pt's husband stated while they were in the office that they had to get something to eat and go home to get clothing.  I told them she needed to go sooner than later and having a non-reassuring NST could mean that her baby is in distress.

## 2017-11-07 NOTE — Progress Notes (Signed)
   PRENATAL VISIT NOTE  Subjective:  Stephanie Figueroa is a 37 y.o. G1P0 at [redacted]w[redacted]d being seen today for ongoing prenatal care.  She is currently monitored for the following issues for this low-risk pregnancy and has Supervision of normal first pregnancy, antepartum; Hyperthyroidism affecting pregnancy in second trimester; Language barrier affecting health care; Limited prenatal care; Gestational diabetes mellitus (GDM) in third trimester; and LGA (large for gestational age) fetus affecting management of mother, third trimester, fetus 1 on their problem list.  Patient reports occasional contractions.  Contractions: Irritability. Vag. Bleeding: None, Small.  Movement: Present. Denies leaking of fluid.   The following portions of the patient's history were reviewed and updated as appropriate: allergies, current medications, past family history, past medical history, past social history, past surgical history and problem list. Problem list updated.  Objective:   Vitals:   11/07/17 0940  BP: 106/76  Pulse: 84  Weight: 144 lb (65.3 kg)    Fetal Status: Fetal Heart Rate (bpm): 135   Movement: Present  Presentation: Vertex  General:  Alert, oriented and cooperative. Patient is in no acute distress.  Skin: Skin is warm and dry. No rash noted.   Cardiovascular: Normal heart rate noted  Respiratory: Normal respiratory effort, no problems with respiration noted  Abdomen: Soft, gravid, appropriate for gestational age.  Pain/Pressure: Present     Pelvic: Cervical exam performed Dilation: 1 Effacement (%): Thick Station: -2  Extremities: Normal range of motion.  Edema: None  Mental Status:  Normal mood and affect. Normal behavior. Normal judgment and thought content.   Assessment and Plan:  Pregnancy: G1P0 at [redacted]w[redacted]d  1. Supervision of normal first pregnancy, antepartum -Patient doing well, no complaints -Continues to miss appointments, states "she forgot that she had appointment"   2. Diet controlled  gestational diabetes mellitus (GDM) in third trimester  3. Non-compliant pregnant patient, third trimester -Patient reports not taking blood sugar levels since diagnoses of GDM  4. LGA (large for gestational age) fetus affecting management of mother, third trimester, fetus 1 -[place] on 2/14 EFW 3613gm per EFW Hadlock approx 4200gm today   5. Non-reactive NST (non-stress test) -patient direct admit to L&D, patient states "needs to go home eat, take shower and do other things". Educated and Discussed importance of going directly to the hospital for IOL, discussed risks to baby with not going to hospital. Husband states "it is up to [place] and not you whether we want to stop by our house or not, we understand the risk".  -orders placed for induction    Direct admission to birthing suites - orders placed for admission   Lajean Manes, CNM

## 2017-11-07 NOTE — Telephone Encounter (Signed)
Preadmission screen Interpreter number [phone]

## 2017-11-07 NOTE — Progress Notes (Signed)
Patient ID: Stephanie Figueroa, female   DOB: 17-May-1981, 37 y.o.   MRN: [id number]  Obstetric History and Physical  Stephanie Figueroa is a 37 y.o. G1P0 with IUP at 77w0dpresenting for IOL for non-reassuring NST. Patient states she has been having regular contractions every 2-384m, none vaginal bleeding, intact membranes, with active fetal movement.    no blurry vision, headaches or peripheral edema, and RUQ pain.   [facility] weeks[facility]: By LMP --->  Estimated Date of Delivery: 3/6/3/01regnancy complications or risks: Patient Active Problem List   Diagnosis Date Noted  . Non-reactive NST (non-stress test) 11/07/2017  . LGA (large for gestational age) fetus affecting management of mother, third trimester, fetus 1 10/24/2017  . Gestational diabetes mellitus (GDM) in third trimester 10/17/2017  . Limited prenatal care 10/14/2017  . Supervision of normal first pregnancy, antepartum 05/23/2017  . Hyperthyroidism affecting pregnancy in second trimester 05/23/2017  . Language barrier affecting health care 05/23/2017   She plans to breastfeed She desires condoms for postpartum contraception.   Sono:    @[redacted]w[redacted]d , CWD, normal anatomy, cephalic presentation, anterior, above cervical os placenta, 3613g, >90% EFW  Prenatal labs and studies: ABO, Rh: B/RH(D) POSITIVE/-- (09/17 1039) Antibody: NO ANTIBODIES DETECTED (09/17 1039) Rubella: 10.60 (09/17 1039) RPR: NON-REACTIVE (02/08 0959)  HBsAg: NON-REACTIVE (09/17 1039)  HIV: NON-REACTIVE (02/08 0959)  [id number]:[id number]/08 0000) 1 hr Glucola  260 Genetic screening normal Anatomy US[place]ormal  Prenatal Transfer Tool  Maternal Diabetes: Yes:  Diabetes Type:  Diet controlled Genetic Screening: Normal Maternal Ultrasounds/Referrals: Normal Fetal Ultrasounds or other Referrals:  None Maternal Substance Abuse:  No Significant Maternal Medications:  None Significant Maternal Lab Results: None  Past Medical  History:  Diagnosis Date  . Fibroma    Left breast  . Hyperthyroidism   . Thyroid disease     Past Surgical History:  Procedure Laterality Date  . PLACEMENT OF BREAST IMPLANTS      OB History  Gravida Para Term Preterm AB Living  1            SAB TAB Ectopic Multiple Live Births               # Outcome Date GA Lbr Len/2nd Weight Sex Delivery Anes PTL Lv  1 Current               Social History   Socioeconomic History  . Marital status: Married    Spouse name: None  . Number of children: None  . Years of education: None  . Highest education level: None  Social Needs  . Financial resource strain: None  . Food insecurity - worry: None  . Food insecurity - inability: None  . Transportation needs - medical: None  . Transportation needs - non-medical: None  Occupational History  . Occupation: homemaker  Tobacco Use  . Smoking status: Never Smoker  . Smokeless tobacco: Never Used  Substance and Sexual Activity  . Alcohol use: No  . Drug use: No  . Sexual activity: Yes    Partners: Male    Birth control/protection: None  Other Topics Concern  . None  Social History Narrative  . None    History reviewed. No pertinent family history.  Medications Prior to Admission  Medication Sig Dispense Refill Last Dose  . ACCU-CHEK FASTCLIX LANCETS MISC 1 Device by Percutaneous route 4 (four) times daily. 100 each 12 Taking  . Blood Glucose Monitoring Suppl (  ACCU-CHEK NANO SMARTVIEW) w/Device KIT 1 kit by Subdermal route as directed. Check blood sugars for fasting, and two hours after breakfast, lunch and dinner (4 checks daily) 1 kit 0 Taking  . glucose blood (ACCU-CHEK SMARTVIEW) test strip Use as instructed to check blood sugars 100 each 12 Taking  . Misc. Devices (BREAST PUMP) MISC Disp 1 hospital grade breast pump EDD 11/07/17 1 each 0 Taking  . Prenatal Vit-Fe Fumarate-FA (PRENATAL VITAMIN PO) Take by mouth.   Taking    No Known Allergies  Review of Systems: Negative  except for what is mentioned in HPI.  Physical Exam: BP 113/75   Pulse 90   Temp 98 F (36.7 C) (Oral)   Resp 18   Ht 5' 4"  (1.626 m)   Wt 65.8 kg (145 lb 1.6 oz)   LMP 01/31/2017   BMI 24.91 kg/m  CONSTITUTIONAL: Well-developed, well-nourished female in no acute distress.  HENT:  Normocephalic, atraumatic, External right and left ear normal. Oropharynx is clear and moist EYES: Conjunctivae and EOM are normal. Pupils are equal, round, and reactive to light. No scleral icterus.  NECK: Normal range of motion, supple, no masses SKIN: Skin is warm and dry. No rash noted. Not diaphoretic. No erythema. No pallor. NEUROLOGIC: Alert and oriented to person, place, and time. Normal reflexes, muscle tone coordination. No cranial nerve deficit noted. PSYCHIATRIC: Normal mood and affect. Normal behavior. Normal judgment and thought content. CARDIOVASCULAR: Normal heart rate noted, regular rhythm RESPIRATORY: Effort and breath sounds normal, no problems with respiration noted ABDOMEN: Soft, nontender, nondistended, gravid. MUSCULOSKELETAL: Normal range of motion. No edema and no tenderness. 2+ distal pulses.  Cervical Exam: Presentation: Vertex Exam by:: (ultrasound by Anyanwu) Presentation: cephalic FHT:  Baseline rate 145 bpm   Variability minimal  Accelerations present   Decelerations late Contractions: Every 2-3 mins   Pertinent Labs/Studies:   Results for orders placed or performed during the hospital encounter of 11/07/17 (from the past 24 hour(s))  Glucose, capillary     Status: Abnormal   Collection Time: 11/07/17  5:19 PM  Result Value Ref Range   Glucose-Capillary 129 (H) 65 - 99 mg/dL    Assessment : Stephanie Figueroa is a 87 y.o. G1P0 at 15w0dbeing admitted for IOL for non-reassuring NST. Diet controled GDM. EFW Hadlock approx 4200gm today.   Plan: Labor: Induction/Augmentation as ordered as per protocol. Analgesia as needed. FWB: Monitor fetal heart tracing.  GBS negative Delivery  plan: Hopeful for vaginal delivery Breastfeeding Contraception: condoms  JLuiz Blare DO OB Fellow Faculty Practice, [place]3/12/2017, 6:18 PM

## 2017-11-07 NOTE — Progress Notes (Signed)
Patient refuses to proceed with IOL, desires primary cesarean. No current indication for cesarean section. FHR tracing reassuring for now Will continue monitoring, re-discuss plan later.  Verita Schneiders, MD, [employer] for [employer], [place]

## 2017-11-08 ENCOUNTER — Inpatient Hospital Stay (HOSPITAL_COMMUNITY): Payer: 59 | Admitting: Anesthesiology

## 2017-11-08 ENCOUNTER — Inpatient Hospital Stay (HOSPITAL_COMMUNITY): Admission: RE | Admit: 2017-11-08 | Payer: 59 | Source: Ambulatory Visit

## 2017-11-08 ENCOUNTER — Encounter (HOSPITAL_COMMUNITY): Payer: Self-pay | Admitting: *Deleted

## 2017-11-08 ENCOUNTER — Encounter (HOSPITAL_COMMUNITY)
Admission: AD | Disposition: A | Discharge status: Home / Self Care | Payer: Self-pay | Source: Ambulatory Visit | Attending: Family Medicine

## 2017-11-08 DIAGNOSIS — Z3A40 40 weeks gestation of pregnancy: Secondary | ICD-10-CM

## 2017-11-08 DIAGNOSIS — O24420 Gestational diabetes mellitus in childbirth, diet controlled: Secondary | ICD-10-CM

## 2017-11-08 DIAGNOSIS — Z98891 History of uterine scar from previous surgery: Secondary | ICD-10-CM

## 2017-11-08 DIAGNOSIS — O3663X0 Maternal care for excessive fetal growth, third trimester, not applicable or unspecified: Secondary | ICD-10-CM

## 2017-11-08 LAB — GLUCOSE, CAPILLARY
GLUCOSE-CAPILLARY: 116 mg/dL — AB (ref 65–99)
Glucose-Capillary: 76 mg/dL (ref 65–99)
Glucose-Capillary: 77 mg/dL (ref 65–99)
Glucose-Capillary: 68 mg/dL (ref 65–99)
Glucose-Capillary: 88 mg/dL (ref 65–99)

## 2017-11-08 LAB — RPR: RPR Ser Ql: NONREACTIVE

## 2017-11-08 SURGERY — Surgical Case
Anesthesia: Spinal

## 2017-11-08 MED ORDER — OXYCODONE-ACETAMINOPHEN 5-325 MG PO TABS: 2.0000 | ORAL_TABLET | ORAL | Status: DC | PRN

## 2017-11-08 MED ORDER — CEFAZOLIN SODIUM-DEXTROSE 2-4 GM/100ML-% IV SOLN
2.0000 g | INTRAVENOUS | Status: AC
  Administered 2017-11-08: 2 g via INTRAVENOUS
  Filled 2017-11-08: qty 100

## 2017-11-08 MED ORDER — FENTANYL CITRATE (PF) 100 MCG/2ML IJ SOLN
INTRAMUSCULAR | Status: AC
  Filled 2017-11-08: qty 2

## 2017-11-08 MED ORDER — ONDANSETRON HCL 4 MG/2ML IJ SOLN
INTRAMUSCULAR | Status: AC
  Filled 2017-11-08: qty 2

## 2017-11-08 MED ORDER — DIPHENHYDRAMINE HCL 50 MG/ML IJ SOLN: 12.5000 mg | INTRAMUSCULAR | Status: DC | PRN

## 2017-11-08 MED ORDER — OXYTOCIN 40 UNITS IN LACTATED RINGERS INFUSION - SIMPLE MED: 2.5000 [IU]/h | INTRAVENOUS | Status: AC

## 2017-11-08 MED ORDER — DEXAMETHASONE SODIUM PHOSPHATE 4 MG/ML IJ SOLN
INTRAMUSCULAR | Status: AC
Start: 2017-11-08 — End: 2017-11-08
  Filled 2017-11-08: qty 1

## 2017-11-08 MED ORDER — DIBUCAINE 1 % RE OINT: 1.0000 "application " | TOPICAL_OINTMENT | RECTAL | Status: DC | PRN

## 2017-11-08 MED ORDER — SODIUM CHLORIDE 0.9 % IR SOLN
Status: DC | PRN
  Administered 2017-11-08: 1

## 2017-11-08 MED ORDER — LACTATED RINGERS IV SOLN
INTRAVENOUS | Status: DC
  Administered 2017-11-08: 22:00:00 via INTRAVENOUS

## 2017-11-08 MED ORDER — ZOLPIDEM TARTRATE 5 MG PO TABS
5.0000 mg | ORAL_TABLET | Freq: Every evening | ORAL | Status: DC | PRN
  Administered 2017-11-08: 5 mg via ORAL
  Filled 2017-11-08: qty 1

## 2017-11-08 MED ORDER — FENTANYL CITRATE (PF) 100 MCG/2ML IJ SOLN: 25.0000 ug | INTRAMUSCULAR | Status: DC | PRN

## 2017-11-08 MED ORDER — DIPHENHYDRAMINE HCL 25 MG PO CAPS: 25.0000 mg | ORAL_CAPSULE | Freq: Four times a day (QID) | ORAL | Status: DC | PRN

## 2017-11-08 MED ORDER — MENTHOL 3 MG MT LOZG: 1.0000 | LOZENGE | OROMUCOSAL | Status: DC | PRN

## 2017-11-08 MED ORDER — NALOXONE HCL 0.4 MG/ML IJ SOLN: 0.4000 mg | INTRAMUSCULAR | Status: DC | PRN

## 2017-11-08 MED ORDER — NALBUPHINE HCL 10 MG/ML IJ SOLN
5.0000 mg | Freq: Once | INTRAMUSCULAR | Status: DC | PRN
Start: 2017-11-08 — End: 2017-11-11

## 2017-11-08 MED ORDER — WITCH HAZEL-GLYCERIN EX PADS: 1.0000 "application " | MEDICATED_PAD | CUTANEOUS | Status: DC | PRN

## 2017-11-08 MED ORDER — TETANUS-DIPHTH-ACELL PERTUSSIS 5-2.5-18.5 LF-MCG/0.5 IM SUSP: 0.5000 mL | Freq: Once | INTRAMUSCULAR | Status: DC

## 2017-11-08 MED ORDER — LACTATED RINGERS IV SOLN: INTRAVENOUS | Status: DC

## 2017-11-08 MED ORDER — SIMETHICONE 80 MG PO CHEW: 80.0000 mg | CHEWABLE_TABLET | ORAL | Status: DC | PRN

## 2017-11-08 MED ORDER — ONDANSETRON HCL 4 MG/2ML IJ SOLN
INTRAMUSCULAR | Status: DC | PRN
  Administered 2017-11-08: 4 mg via INTRAVENOUS

## 2017-11-08 MED ORDER — LACTATED RINGERS IV SOLN
INTRAVENOUS | Status: DC | PRN
  Administered 2017-11-08 (×2): via INTRAVENOUS

## 2017-11-08 MED ORDER — DIPHENHYDRAMINE HCL 25 MG PO CAPS: 25.0000 mg | ORAL_CAPSULE | ORAL | Status: DC | PRN

## 2017-11-08 MED ORDER — KETOROLAC TROMETHAMINE 30 MG/ML IJ SOLN: 30.0000 mg | Freq: Four times a day (QID) | INTRAMUSCULAR | Status: AC | PRN

## 2017-11-08 MED ORDER — SIMETHICONE 80 MG PO CHEW
80.0000 mg | CHEWABLE_TABLET | ORAL | Status: DC
  Administered 2017-11-09 – 2017-11-11 (×2): 80 mg via ORAL
  Filled 2017-11-08 (×2): qty 1

## 2017-11-08 MED ORDER — OXYTOCIN 10 UNIT/ML IJ SOLN
INTRAMUSCULAR | Status: AC
  Filled 2017-11-08: qty 4

## 2017-11-08 MED ORDER — PHENYLEPHRINE 8 MG IN D5W 100 ML (0.08MG/ML) PREMIX OPTIME
INJECTION | INTRAVENOUS | Status: AC
Start: 2017-11-08 — End: 2017-11-08
  Filled 2017-11-08: qty 100

## 2017-11-08 MED ORDER — NALBUPHINE HCL 10 MG/ML IJ SOLN: 5.0000 mg | INTRAMUSCULAR | Status: DC | PRN

## 2017-11-08 MED ORDER — SENNOSIDES-DOCUSATE SODIUM 8.6-50 MG PO TABS
2.0000 | ORAL_TABLET | ORAL | Status: DC
  Administered 2017-11-10: 2 via ORAL
  Filled 2017-11-08 (×2): qty 2

## 2017-11-08 MED ORDER — PRENATAL MULTIVITAMIN CH
1.0000 | ORAL_TABLET | Freq: Every day | ORAL | Status: DC
  Administered 2017-11-10: 1 via ORAL
  Filled 2017-11-08: qty 1

## 2017-11-08 MED ORDER — FENTANYL CITRATE (PF) 100 MCG/2ML IJ SOLN
INTRAMUSCULAR | Status: DC | PRN
  Administered 2017-11-08: 1 ug via INTRATHECAL

## 2017-11-08 MED ORDER — MEPERIDINE HCL 25 MG/ML IJ SOLN: 6.2500 mg | INTRAMUSCULAR | Status: DC | PRN

## 2017-11-08 MED ORDER — METOCLOPRAMIDE HCL 5 MG/ML IJ SOLN: 10.0000 mg | Freq: Once | INTRAMUSCULAR | Status: DC | PRN

## 2017-11-08 MED ORDER — DEXAMETHASONE SODIUM PHOSPHATE 4 MG/ML IJ SOLN
INTRAMUSCULAR | Status: DC | PRN
  Administered 2017-11-08: 4 mg via INTRAVENOUS

## 2017-11-08 MED ORDER — SCOPOLAMINE 1 MG/3DAYS TD PT72
MEDICATED_PATCH | TRANSDERMAL | Status: DC | PRN
  Administered 2017-11-08: 1 via TRANSDERMAL

## 2017-11-08 MED ORDER — MORPHINE SULFATE (PF) 0.5 MG/ML IJ SOLN
INTRAMUSCULAR | Status: AC
  Filled 2017-11-08: qty 10

## 2017-11-08 MED ORDER — ACETAMINOPHEN 325 MG PO TABS
650.0000 mg | ORAL_TABLET | ORAL | Status: DC | PRN
  Administered 2017-11-09: 650 mg via ORAL
  Filled 2017-11-08 (×2): qty 2

## 2017-11-08 MED ORDER — SODIUM CHLORIDE 0.9% FLUSH: 3.0000 mL | INTRAVENOUS | Status: DC | PRN

## 2017-11-08 MED ORDER — ACETAMINOPHEN 500 MG PO TABS
1000.0000 mg | ORAL_TABLET | Freq: Four times a day (QID) | ORAL | Status: AC
  Filled 2017-11-08: qty 2

## 2017-11-08 MED ORDER — OXYCODONE-ACETAMINOPHEN 5-325 MG PO TABS: 1.0000 | ORAL_TABLET | ORAL | Status: DC | PRN

## 2017-11-08 MED ORDER — SCOPOLAMINE 1 MG/3DAYS TD PT72: 1.0000 | MEDICATED_PATCH | Freq: Once | TRANSDERMAL | Status: DC

## 2017-11-08 MED ORDER — ZOLPIDEM TARTRATE 5 MG PO TABS: 5.0000 mg | ORAL_TABLET | Freq: Every evening | ORAL | Status: DC | PRN

## 2017-11-08 MED ORDER — NALBUPHINE HCL 10 MG/ML IJ SOLN
5.0000 mg | INTRAMUSCULAR | Status: DC | PRN
Start: 2017-11-08 — End: 2017-11-11

## 2017-11-08 MED ORDER — ONDANSETRON HCL 4 MG/2ML IJ SOLN: 4.0000 mg | Freq: Three times a day (TID) | INTRAMUSCULAR | Status: DC | PRN

## 2017-11-08 MED ORDER — SIMETHICONE 80 MG PO CHEW
80.0000 mg | CHEWABLE_TABLET | Freq: Three times a day (TID) | ORAL | Status: DC
  Administered 2017-11-09 – 2017-11-11 (×7): 80 mg via ORAL
  Filled 2017-11-08 (×7): qty 1

## 2017-11-08 MED ORDER — ENOXAPARIN SODIUM 40 MG/0.4ML ~~LOC~~ SOLN
40.0000 mg | SUBCUTANEOUS | Status: DC
  Administered 2017-11-09 – 2017-11-10 (×2): 40 mg via SUBCUTANEOUS
  Filled 2017-11-08 (×4): qty 0.4

## 2017-11-08 MED ORDER — BUPIVACAINE IN DEXTROSE 0.75-8.25 % IT SOLN
INTRATHECAL | Status: DC | PRN
  Administered 2017-11-08: 1.5 mL via INTRATHECAL

## 2017-11-08 MED ORDER — IBUPROFEN 600 MG PO TABS
600.0000 mg | ORAL_TABLET | Freq: Four times a day (QID) | ORAL | Status: DC
  Administered 2017-11-09 – 2017-11-11 (×4): 600 mg via ORAL
  Filled 2017-11-08 (×10): qty 1

## 2017-11-08 MED ORDER — NALOXONE HCL 4 MG/10ML IJ SOLN: 1.0000 ug/kg/h | INTRAVENOUS | Status: DC | PRN

## 2017-11-08 MED ORDER — OXYTOCIN 10 UNIT/ML IJ SOLN
INTRAVENOUS | Status: DC | PRN
  Administered 2017-11-08: 40 [IU] via INTRAVENOUS

## 2017-11-08 MED ORDER — PHENYLEPHRINE 8 MG IN D5W 100 ML (0.08MG/ML) PREMIX OPTIME
INJECTION | INTRAVENOUS | Status: DC | PRN
  Administered 2017-11-08: 20 ug/min via INTRAVENOUS

## 2017-11-08 MED ORDER — MORPHINE SULFATE (PF) 0.5 MG/ML IJ SOLN
INTRAMUSCULAR | Status: DC | PRN
  Administered 2017-11-08: .2 mg via INTRATHECAL

## 2017-11-08 MED ORDER — COCONUT OIL OIL
1.0000 "application " | TOPICAL_OIL | Status: DC | PRN
  Filled 2017-11-08 (×2): qty 120

## 2017-11-08 MED ORDER — NALBUPHINE HCL 10 MG/ML IJ SOLN: 5.0000 mg | Freq: Once | INTRAMUSCULAR | Status: DC | PRN

## 2017-11-08 SURGICAL SUPPLY — 33 items
BENZOIN TINCTURE PRP APPL 2/3 (GAUZE/BANDAGES/DRESSINGS) ×3 IMPLANT
CHLORAPREP W/TINT 26ML (MISCELLANEOUS) ×3 IMPLANT
CLAMP CORD UMBIL (MISCELLANEOUS) IMPLANT
CLOSURE STERI-STRIP 1/2X4 (GAUZE/BANDAGES/DRESSINGS) ×1
CLOSURE WOUND 1/2 X4 (GAUZE/BANDAGES/DRESSINGS) ×1
CLOTH BEACON ORANGE TIMEOUT ST (SAFETY) ×3 IMPLANT
CLSR STERI-STRIP ANTIMIC 1/2X4 (GAUZE/BANDAGES/DRESSINGS) ×2 IMPLANT
DRSG OPSITE POSTOP 4X10 (GAUZE/BANDAGES/DRESSINGS) ×3 IMPLANT
ELECT REM PT RETURN 9FT ADLT (ELECTROSURGICAL) ×3
ELECTRODE REM PT RTRN 9FT ADLT (ELECTROSURGICAL) ×1 IMPLANT
EXTRACTOR VACUUM M CUP 4 TUBE (SUCTIONS) IMPLANT
EXTRACTOR VACUUM M CUP 4' TUBE (SUCTIONS)
GLOVE BIOGEL PI IND STRL 7.0 (GLOVE) ×2 IMPLANT
GLOVE BIOGEL PI IND STRL 7.5 (GLOVE) ×2 IMPLANT
GLOVE BIOGEL PI INDICATOR 7.0 (GLOVE) ×4
GLOVE BIOGEL PI INDICATOR 7.5 (GLOVE) ×4
GLOVE ECLIPSE 7.5 STRL STRAW (GLOVE) ×3 IMPLANT
GOWN STRL REUS W/TWL LRG LVL3 (GOWN DISPOSABLE) ×9 IMPLANT
KIT ABG SYR 3ML LUER SLIP (SYRINGE) IMPLANT
NEEDLE HYPO 25X5/8 SAFETYGLIDE (NEEDLE) IMPLANT
NS IRRIG 1000ML POUR BTL (IV SOLUTION) ×3 IMPLANT
PACK C SECTION WH (CUSTOM PROCEDURE TRAY) ×3 IMPLANT
PAD OB MATERNITY 4.3X12.25 (PERSONAL CARE ITEMS) ×3 IMPLANT
PENCIL SMOKE EVAC W/HOLSTER (ELECTROSURGICAL) ×3 IMPLANT
RTRCTR C-SECT PINK 25CM LRG (MISCELLANEOUS) ×3 IMPLANT
STRIP CLOSURE SKIN 1/2X4 (GAUZE/BANDAGES/DRESSINGS) ×2 IMPLANT
SUT VIC AB 0 CTX 36 (SUTURE) ×6
SUT VIC AB 0 CTX36XBRD ANBCTRL (SUTURE) ×3 IMPLANT
SUT VIC AB 2-0 CT1 27 (SUTURE) ×2
SUT VIC AB 2-0 CT1 TAPERPNT 27 (SUTURE) ×1 IMPLANT
SUT VIC AB 4-0 KS 27 (SUTURE) ×3 IMPLANT
TOWEL OR 17X24 6PK STRL BLUE (TOWEL DISPOSABLE) ×3 IMPLANT
TRAY FOLEY BAG SILVER LF 14FR (SET/KITS/TRAYS/PACK) ×3 IMPLANT

## 2017-11-08 NOTE — Progress Notes (Addendum)
Patient ID: Stephanie Figueroa, female   DOB: [birth year], 37 y.o.   MRN: [id number]  Patient seen at [facility] - patient last night was admitted for induction for NR FHT and GDM. After 1 dose of cytotec, pt declined further inductive measures and expressed desire for PLTCS. I discussed reasons for CS - patient recognizes that this is a large baby - estimated 4200g by extrapolation. She is afraid of being induced and ending in a cesarean after a [place] induction. I discussed risks of cesarean section vs NSVD, including but not limited to: bleeding which may require transfusion or reoperation; infection which may require antibiotics; injury to bowel, bladder, ureters or other surrounding organs; injury to the fetus; need for additional procedures including hysterectomy in the event of a life-threatening hemorrhage; placental abnormalities wth subsequent pregnancies, incisional problems, thromboembolic phenomenon and other postoperative/anesthesia complications.   While it is not our standard practice to offer PLTCS to patient's with EFW <4500g, which is the guidelines by ACOG, some MFMs are starting to offer PLTCS at 4000 to 4200g. I do think this is reasonable in this instance to allow a PLTCS for fetal macrosomia.  Patient still desirous for PLTCS. Patient had a few bites of grits this AM - as this is not a emergent situation, will hold the patient NPO for 6 hours. Anesthesia and OR aware.  Preoperative prophylactic Ancef ordered on call to the OR.  To OR when ready.  Truett Mainland, DO 11/08/2017 10:57 AM

## 2017-11-08 NOTE — Op Note (Signed)
Stephanie Figueroa PROCEDURE DATE: 11/08/2017  PREOPERATIVE DIAGNOSES: Intrauterine pregnancy at [redacted]w[redacted]d weeks gestation; macrosomia, patient request  POSTOPERATIVE DIAGNOSES: The same  PROCEDURE: Primary Low Transverse Cesarean Section  SURGEON, primary: Loma Boston, DO SURGEON, fellow: Dannielle Huh, DO  ANESTHESIOLOGY TEAM: Anesthesiologist: Montez Hageman, MD CRNA: Genevie Ann, CRNA  INDICATIONS: Stephanie Figueroa is a 37 y.o. G1P1001 at [redacted]w[redacted]d here for cesarean section secondary to the indications listed under preoperative diagnoses; please see preoperative note for further details.  The risks of cesarean section were discussed with the patient including but were not limited to: bleeding which may require transfusion or reoperation; infection which may require antibiotics; injury to bowel, bladder, ureters or other surrounding organs; injury to the fetus; need for additional procedures including hysterectomy in the event of a life-threatening hemorrhage; placental abnormalities wth subsequent pregnancies, incisional problems, thromboembolic phenomenon and other postoperative/anesthesia complications.   The patient concurred with the proposed plan, giving informed written consent for the procedure.    FINDINGS:  Viable female infant in cephalic presentation.  Apgars 9 and 9.  Clear amniotic fluid.  Intact placenta, three vessel cord.  Normal uterus, fallopian tubes and ovaries bilaterally.  ANESTHESIA: Spinal  ESTIMATED BLOOD LOSS: 500 ml URINE OUTPUT:  100 ml SPECIMENS: Placenta sent to L&D COMPLICATIONS: None immediate  PROCEDURE IN DETAIL:  The patient preoperatively received intravenous antibiotics and had sequential compression devices applied to her lower extremities.  She was then taken to the operating room where spinal anesthesia was administered and was found to be adequate. She was then placed in a dorsal supine position with a leftward tilt, and prepped and draped in a sterile manner.  A  foley catheter was placed into her bladder and attached to constant gravity.  After an adequate timeout was performed, a Pfannenstiel skin incision was made with scalpel and carried through to the underlying layer of fascia. The fascia was incised in the midline, and this incision was extended bilaterally using the Mayo scissors.  Kocher clamps were applied to the superior aspect of the fascial incision and the underlying rectus muscles were dissected off bluntly.  A similar process was carried out on the inferior aspect of the fascial incision. The rectus muscles were separated in the midline bluntly and the peritoneum was entered bluntly. Attention was turned to the lower uterine segment where a low transverse hysterotomy was made with a scalpel and extended bilaterally bluntly.  The infant was successfully delivered, the cord was clamped and cut after one minute, and the infant was handed over to the awaiting neonatology team. Uterine massage was then administered, and the placenta delivered intact with a three-vessel cord. The uterus was then cleared of clots and debris.  The hysterotomy was closed with 0 Vicryl in a running locked fashion, and an imbricating layer was also placed with 0 Vicryl. The pelvis was cleared of all clot and debris. Hemostasis was confirmed on all surfaces.  The peritoneum was closed with a 0 Vicryl running stitch. The fascia was then closed using 0 Vicryl in a running fashion.  The subcutaneous layer was irrigated. The skin was closed with a subcuticular stitch with 4-0 vicryl Lanny Hurst needle. The patient tolerated the procedure well. Sponge, lap, instrument and needle counts were correct x 3.  She was taken to the recovery room in stable condition.    Dannielle Huh, [place], [facility]

## 2017-11-08 NOTE — Progress Notes (Signed)
POSTPARTUM PROGRESS NOTE  Post Partum Day 1 Subjective:  Kayli Beal is a 37 y.o. G1P1001 [redacted]w[redacted]d s/p pLTCS for macrosomia.  No acute events overnight.  Pt denies problems with ambulating, voiding or po intake.  She denies nausea or vomiting.  Pain is well controlled. Lochia Minimal.   Objective: Blood pressure 105/60, pulse 64, temperature 98.1 F (36.7 C), resp. rate 18, height 5\' 4"  (1.626 m), weight 65.8 kg (145 lb 1.6 oz), last menstrual period 01/31/2017, SpO2 96 %, unknown if currently breastfeeding.  Physical Exam:  General: alert, cooperative and no distress Lochia:normal flow Chest: no respiratory distress Heart:regular rate, distal pulses intact Abdomen: soft, nontender,  Uterine Fundus: firm, appropriately tender DVT Evaluation: No calf swelling or tenderness Extremities: no edema  Recent Labs    11/07/17 1751 11/09/17 0519  HGB 12.9 12.8  HCT 36.5 35.5*    Assessment/Plan:  ASSESSMENT: Ramiyah Mcclenahan is a 37 y.o. G1P1001 [redacted]w[redacted]d s/p pLTCS for macrosomia.  Plan for discharge tomorrow   LOS: 2 days   Tressie Ragin MossDO 11/09/2017, 6:44 AM

## 2017-11-08 NOTE — Anesthesia Procedure Notes (Signed)
Spinal  Patient location during procedure: OR Start time: 11/08/2017 2:57 PM End time: 11/08/2017 3:04 PM Staffing Anesthesiologist: Montez Hageman, MD Performed: other anesthesia staff  Preanesthetic Checklist Completed: patient identified, surgical consent, pre-op evaluation, timeout performed, IV checked, risks and benefits discussed and monitors and equipment checked Spinal Block Patient position: sitting Prep: DuraPrep Patient monitoring: heart rate, continuous pulse ox and blood pressure Approach: midline Location: L3-4 Injection technique: single-shot Needle Needle type: Pencan  Needle gauge: 24 G Needle length: 10 cm Assessment Sensory level: T4 Additional Notes Performed by Garner Gavel SRNA under direct supervision

## 2017-11-08 NOTE — Transfer of Care (Signed)
Immediate Anesthesia Transfer of Care Note  Patient: Stephanie Figueroa  Procedure(s) Performed: CESAREAN SECTION (N/A )  Patient Location: PACU  Anesthesia Type:Spinal  Level of Consciousness: awake, alert  and oriented  Airway & Oxygen Therapy: Patient Spontanous Breathing  Post-op Assessment: Report given to RN and Post -op Vital signs reviewed and stable  Post vital signs: Reviewed and stable  Last Vitals:  Vitals:   11/08/17 1145 11/08/17 1318  BP: 100/61 102/60  Pulse: 75 70  Resp: 16 16  Temp: 36.7 C   SpO2:      Last Pain:  Vitals:   11/08/17 1318  TempSrc:   PainSc: 3          Complications: No apparent anesthesia complications

## 2017-11-08 NOTE — Progress Notes (Signed)
Dr. Nehemiah Settle made aware of pt low blood glucose level--orders to hold treatment due to pt going to OR shortly

## 2017-11-08 NOTE — Anesthesia Pain Management Evaluation Note (Signed)
  CRNA Pain Management Visit Note  Patient: Stephanie Figueroa, 37 y.o., female  "Hello I am a member of the anesthesia team at [facility]. We have an anesthesia team available at all times to provide care throughout the hospital, including epidural management and anesthesia for C-section. I don't know your plan for the delivery whether it a natural birth, water birth, IV sedation, nitrous supplementation, doula or epidural, but we want to meet your pain goals."   1.Was your pain managed to your expectations on prior hospitalizations?   No prior hospitalizations  2.What is your expectation for pain management during this hospitalization?     Epidural  3.How can we help you reach that goal? Be available  Record the patient's initial score and the patient's pain goal.   Pain: 0  Pain Goal: 5;pt declined an interpreter, states she understands what I am saying;significant other assisting and ensuring understanding [facility] wants you to be able to say your pain was always managed very well.  Everette Rank 11/08/2017

## 2017-11-08 NOTE — Anesthesia Preprocedure Evaluation (Signed)
Anesthesia Evaluation  Patient identified by MRN, date of birth, ID band Patient awake    Reviewed: Allergy & Precautions, NPO status , Patient's Chart, lab work & pertinent test results  Airway Mallampati: II  TM Distance: >3 FB Neck ROM: Full    Dental no notable dental hx.    Pulmonary neg pulmonary ROS,    Pulmonary exam normal breath sounds clear to auscultation       Cardiovascular negative cardio ROS Normal cardiovascular exam Rhythm:Regular Rate:Normal     Neuro/Psych negative neurological ROS  negative psych ROS   GI/Hepatic negative GI ROS, Neg liver ROS,   Endo/Other  negative endocrine ROS  Renal/GU negative Renal ROS  negative genitourinary   Musculoskeletal negative musculoskeletal ROS (+)   Abdominal   Peds negative pediatric ROS (+)  Hematology negative hematology ROS (+)   Anesthesia Other Findings   Reproductive/Obstetrics (+) Pregnancy                             Anesthesia Physical Anesthesia Plan  ASA: II  Anesthesia Plan: Spinal   Post-op Pain Management:    Induction:   PONV Risk Score and Plan: 2 and Ondansetron and Treatment may vary due to age or medical condition  Airway Management Planned: Natural Airway  Additional Equipment:   Intra-op Plan:   Post-operative Plan:   Informed Consent: I have reviewed the patients History and Physical, chart, labs and discussed the procedure including the risks, benefits and alternatives for the proposed anesthesia with the patient or authorized representative who has indicated his/her understanding and acceptance.   Dental advisory given  Plan Discussed with: CRNA  Anesthesia Plan Comments:         Anesthesia Quick Evaluation

## 2017-11-08 NOTE — Progress Notes (Signed)
Spoke with Dr. Manus Rudd on the phone about the honeycomb dressing that was mostly saturated and starting to peel off. Dr. Manus Rudd said to both change the honeycomb dressing and to apply a pressure dressing.

## 2017-11-09 ENCOUNTER — Encounter (HOSPITAL_COMMUNITY): Payer: Self-pay | Admitting: Family Medicine

## 2017-11-09 LAB — CBC
HCT: 35.5 % — ABNORMAL LOW (ref 36.0–46.0)
Hemoglobin: 12.8 g/dL (ref 12.0–15.0)
MCH: 35.1 pg — AB (ref 26.0–34.0)
MCHC: 36.1 g/dL — ABNORMAL HIGH (ref 30.0–36.0)
MCV: 97.3 fL (ref 78.0–100.0)
Platelets: 176 10*3/uL (ref 150–400)
RBC: 3.65 MIL/uL — AB (ref 3.87–5.11)
RDW: 12.7 % (ref 11.5–15.5)
WBC: 14.4 10*3/uL — AB (ref 4.0–10.5)

## 2017-11-09 LAB — CREATININE, SERUM
Creatinine, Ser: 0.55 mg/dL (ref 0.44–1.00)
GFR calc non Af Amer: >60 mL/min (ref >60)

## 2017-11-09 LAB — GLUCOSE, CAPILLARY
GLUCOSE-CAPILLARY: 75 mg/dL (ref 65–99)
Glucose-Capillary: 71 mg/dL (ref 65–99)

## 2017-11-09 NOTE — Anesthesia Postprocedure Evaluation (Signed)
Anesthesia Post Note  Patient: Stephanie Figueroa  Procedure(s) Performed: CESAREAN SECTION (N/A )     Patient location during evaluation: Mother Baby Anesthesia Type: Spinal Level of consciousness: awake, awake and alert and oriented Pain management: pain level controlled Vital Signs Assessment: post-procedure vital signs reviewed and stable Respiratory status: spontaneous breathing, nonlabored ventilation and respiratory function stable Cardiovascular status: stable Postop Assessment: no headache, no backache, patient able to bend at knees, no apparent nausea or vomiting and adequate PO intake Anesthetic complications: no    Last Vitals:  Vitals:   11/09/17 0500 11/09/17 0523  BP:    Pulse:    Resp:    Temp:    SpO2: 96% 96%    Last Pain:  Vitals:   11/08/17 1849  TempSrc: Oral  PainSc: 0-No pain   Pain Goal:                 Tymesha Ditmore

## 2017-11-10 ENCOUNTER — Encounter (HOSPITAL_COMMUNITY): Payer: Self-pay | Admitting: Family Medicine

## 2017-11-10 MED ORDER — POLYVINYL ALCOHOL 1.4 % OP SOLN
1.0000 [drp] | OPHTHALMIC | Status: DC | PRN
  Administered 2017-11-10: 2 [drp] via OPHTHALMIC
  Filled 2017-11-10: qty 15

## 2017-11-10 MED ORDER — NAPHAZOLINE-GLYCERIN 0.012-0.2 % OP SOLN: 1.0000 [drp] | Freq: Four times a day (QID) | OPHTHALMIC | Status: DC | PRN

## 2017-11-10 NOTE — Progress Notes (Signed)
Subjective: Postpartum Day 2: Cesarean Delivery Patient reports incisional pain, tolerating PO and no problems voiding.    Objective: Vital signs in last 24 hours: Temp:  [98.2 F (36.8 C)-98.9 F (37.2 C)] 98.2 F (36.8 C) (03/06 2300) Pulse Rate:  [64-68] 64 (03/06 2300) Resp:  [14-18] 16 (03/06 2300) BP: (87-106)/(50-63) 99/61 (03/06 2300) SpO2:  [96 %-97 %] 96 % (03/06 1636)  Physical Exam:  General: alert, cooperative and no distress Lochia: appropriate Uterine Fundus: firm Incision: healing well, no significant drainage DVT Evaluation: No evidence of DVT seen on physical exam.  Recent Labs    11/07/17 1751 11/09/17 0519  HGB 12.9 12.8  HCT 36.5 35.5*    Assessment/Plan: Status post Cesarean section. Doing well postoperatively. anticipate discharge tomorrow (does not want to go home today)  Continue current care.  Stephanie Figueroa 11/10/2017, 7:44 AM

## 2017-11-10 NOTE — Lactation Note (Signed)
This note was copied from a baby's chart. Lactation Consultation Note  Patient Name: Girl Letha Mirabal FUXNA'T Date: 11/10/2017 Reason for consult: Initial assessment   Maternal Data Has patient been taught Hand Expression?: Yes Does the patient have breastfeeding experience prior to this delivery?: No  Feeding Feeding Type: Breast Fed Length of feed: 25 min(still BF)  LATCH Score Latch: Grasps breast easily, tongue down, lips flanged, rhythmical sucking.  Audible Swallowing: Spontaneous and intermittent  Type of Nipple: Everted at rest and after stimulation  Comfort (Breast/Nipple): Soft / non-tender  Hold (Positioning): Assistance needed to correctly position infant at breast and maintain latch.  LATCH Score: 9  Interventions Interventions: Breast feeding basics reviewed;Coconut oil;Assisted with latch;Breast compression;Skin to skin;Adjust position;Breast massage;Support pillows;Hand express;Position options;Expressed milk  Lactation Tools Discussed/Used     Consult Status Consult Status: Follow-up Date: 11/11/17 Follow-up type: In-patient    Lipa Knauff, Elta Guadeloupe 11/10/2017, 1:28 AM

## 2017-11-10 NOTE — Addendum Note (Signed)
Addendum  created 11/10/17 1021 by Montez Hageman, MD   Intraprocedure Event edited

## 2017-11-10 NOTE — Progress Notes (Signed)
Patient reports she has had blurred vision since last evening when reading dry erase board from her bed, print in books and texting on the phone. BP was WNL. Tolerates OOB well, denies dizziness or headaches. CBC WNL. Patient had been touching scop patch on side of her neck and observed afterwards rubbing her face and eyes. Scop patch removed by RN, area washed and patient thoroughly washed her hands and face. Patient reports being very tired and rest encouraged. Patient requesting eye drops. Patient continued to have blurred vision and report called to Dr. Ky Barban. Order obtained for eye drops.

## 2017-11-10 NOTE — Lactation Note (Signed)
This note was copied from a baby's chart. Lactation Consultation Note Baby 35 hrs old. Mom states BF well. No difficulty. FOB at bedside, very active in explaining to make sure mom understands. Mom speaks good [place],. Mom has round full breast w/great everted nipples. Mom concerned if baby getting anything or enough. Encouraged mom to feel breast before and after BF for transfer. After feeding for a while mom assessed breast, noted significant softening of breast. Mom happy to understand baby getting colostrum. Demonstrated hand expression w/easy flow of colostrum.  Mom wants to keep baby covered and warm. Encouraged not to have baby to hot.  Newborn behavior, STS, I&O, cluster feeding, supply and demand, engorgement, prevention, filling, milk storage, and questions mom had educated. Mom encouraged to feed baby 8-12 times/24 hours and with feeding cues. Mom encouraged to waken baby for feeds.  Encouraged to call for assistance or questions. Parents feel good about d/c, has family support as well. [place] brochure given w/resources, support groups and [place] services.  Patient Name: Stephanie Figueroa OHYWV'P Date: 11/10/2017 Reason for consult: Initial assessment   Maternal Data Has patient been taught Hand Expression?: Yes Does the patient have breastfeeding experience prior to this delivery?: No  Feeding Feeding Type: Breast Fed Length of feed: 25 min(still BF)  LATCH Score Latch: Grasps breast easily, tongue down, lips flanged, rhythmical sucking.  Audible Swallowing: Spontaneous and intermittent  Type of Nipple: Everted at rest and after stimulation  Comfort (Breast/Nipple): Soft / non-tender  Hold (Positioning): Assistance needed to correctly position infant at breast and maintain latch.  LATCH Score: 9  Interventions Interventions: Breast feeding basics reviewed;Coconut oil;Assisted with latch;Breast compression;Skin to skin;Adjust position;Breast massage;Support pillows;Hand  express;Position options;Expressed milk  Lactation Tools Discussed/Used     Consult Status Consult Status: Complete Date: 11/10/17    Theodoro Kalata 11/10/2017, 1:18 AM

## 2017-11-11 MED ORDER — IBUPROFEN 600 MG PO TABS: 600.0000 mg | ORAL_TABLET | Freq: Four times a day (QID) | ORAL | 0 refills | Status: AC

## 2017-11-11 MED ORDER — OXYCODONE-ACETAMINOPHEN 5-325 MG PO TABS: 1.0000 | ORAL_TABLET | ORAL | 0 refills | Status: DC | PRN

## 2017-11-11 NOTE — Discharge Summary (Signed)
OB Discharge Summary    Patient Name: Stephanie Figueroa DOB: 04/08/1981 MRN: [id number] Date of admission: 11/07/2017  Delivering MD: Stephanie Figueroa )  Date of discharge: 11/11/2017  Admitting diagnosis: IOL for non-reassuring NST Intrauterine pregnancy: [redacted]w[redacted]d   Secondary diagnosis:  Active Problems:   Patient Active Problem List   Diagnosis Date Noted  . S/P C-section 11/08/2017  . Non-reactive NST (non-stress test) 11/07/2017  . LGA (large for gestational age) fetus affecting management of mother, third trimester, fetus 1 10/24/2017  . Gestational diabetes mellitus (GDM) in third trimester 10/17/2017  . Limited prenatal care 10/14/2017  . Supervision of normal first pregnancy, antepartum 05/23/2017  . Hyperthyroidism affecting pregnancy in second trimester 05/23/2017  . Language barrier affecting health care 05/23/2017   Additional problems:  A1GDM, diet controlled     Discharge diagnosis: Term Pregnancy Delivered via primary C/S                                                                                    Post partum procedures:None  Complications: None  Hospital course:  Induction of Labor With Cesarean Section  37y.o.G1P1001 at 446w1das admitted to the hospital 11/07/2017 for induction of labor. Patient had an uncomplicated labor course, however declined further inductive measures after 1 dose of cytotec and obtained pLTCS on patient request due to [place] 4300[place] by extrapolation[place] She delivered a Viable infant,11/08/2017  Membrane Rupture Time/Date: 3:24 PM ,11/08/2017   Details of operation can be found in separate operative Note.  Patient had an uncomplicated postpartum course. She is ambulating, tolerating a regular diet, passing flatus, and urinating well.  Patient is discharged home in stable condition on 11/11/17.                                   Physical exam  Vitals:   11/10/17 1755 11/11/17 0545  BP: 106/63 95/66  Pulse: 74 68  Resp: 18 18  Temp: 98.5 F (36.9  C) 97.8 F (36.6 C)  SpO2:      General: alert, cooperative and no distress Lochia: appropriate Uterine Fundus: firm Incision: Honeycomb dressing with some saturation, dressing intact. DVT Evaluation: No evidence of DVT seen on physical exam.  Labs: No results found for this or any previous visit (from the past 24 hour(s)).  Discharge instruction: per After Visit Summary and "Baby and Me Booklet".  After visit meds:  No Known Allergies  Allergies as of 11/11/2017   No Known Allergies     Medication List    STOP taking these medications   ACCU-CHEK FASTCLIX LANCETS Misc   ACCU-CHEK NANO SMARTVIEW w/Device Kit   glucose blood test strip Commonly known as:  ACCU-CHEK SMARTVIEW     TAKE these medications   Breast Pump Misc Disp 1 hospital grade breast pump EDD 11/07/17   ibuprofen 600 MG tablet Commonly known as:  ADVIL,MOTRIN Take 1 tablet (600 mg total) by mouth every 6 (six) hours.   oxyCODONE-acetaminophen 5-325 MG tablet Commonly known as:  PERCOCET Take 1-2 tablets by mouth every 4 (four) hours as needed for severe  pain.   PRENATAL VITAMIN PO Take by mouth.      Diet: routine diet  Activity: Advance as tolerated. Pelvic rest for 6 weeks.   Outpatient follow up:1 week for skin incision check, 4 weeks postpartum follow up Future Appointments:  Future Appointments  Date Time Provider [place]  11/16/2017  9:15 AM Constant, Vickii Chafe, MD [facility]  12/21/2017  9:00 AM Stephanie Bunde, MD [facility]    Follow up Appt: No Follow-up on file.  Postpartum contraception: Condoms  Newborn Data: APGAR (1 MIN): 9   APGAR (5 MINS): 9    Baby Feeding: Breast Disposition:home with mother  Stephanie Percy, DO  11/11/2017

## 2017-11-11 NOTE — Lactation Note (Signed)
This note was copied from a baby's chart. Lactation Consultation Note LC had reviewed everything possible w/mom and dad. Mom concerned of how to give BM. Bottles given to pump in. Reviewed milk storage. Reported to Rn. Patient Name: Stephanie Figueroa LEXNT'Z Date: 11/11/2017 Reason for consult: Follow-up assessment   Maternal Data    Feeding Feeding Type: Bottle Fed - Breast Milk Nipple Type: Slow - flow Length of feed: 40 min  LATCH Score Latch: Grasps breast easily, tongue down, lips flanged, rhythmical sucking.  Audible Swallowing: Spontaneous and intermittent  Type of Nipple: Everted at rest and after stimulation  Comfort (Breast/Nipple): Engorged, cracked, bleeding, large blisters, severe discomfort  Hold (Positioning): Assistance needed to correctly position infant at breast and maintain latch.  LATCH Score: 7  Interventions Interventions: Breast feeding basics reviewed;Position options;Assisted with latch;Expressed milk;DEBP;Skin to skin;Breast compression;Coconut oil;Breast massage;Adjust position;Shells;Hand express;Support pillows;Comfort gels  Lactation Tools Discussed/Used Tools: Pump;Coconut oil;Comfort gels;Nipple Jefferson Fuel;Shells Nipple shield size: 20 Shell Type: Inverted Breast pump type: Double-Electric Breast Pump Pump Review: Setup, frequency, and cleaning;Milk Storage Initiated by:: RN Date initiated:: 11/10/17   Consult Status Consult Status: Complete Date: 11/11/17 Follow-up type: In-patient    Taletha Twiford, Elta Guadeloupe 11/11/2017, 7:07 AM

## 2017-11-11 NOTE — Progress Notes (Signed)
CSW received consult for emotional concerns for MOB in room 108.  CSW met with MOB to offer support and complete assessment. CSW used Pharmacist, hospital (ID [phone] with [employer]) to assist with language barriers.     When CSW arrived, MOB's sister was holding infant, FOB and MOB were cleaning room preparing for discharge.  CSW explained CSW's role and MOB gave CSW permission to complete the assessment while MOB's guests were present. MOB was polite, easy to engage, and receptive to meeting with CSW.  CSW asked about MOB's emotions since giving birth and MOB begin to cry.  MOB shared that breastfeeding has been painful for MOB and MOB is tried; Per MOB, MOB has not slept in 3 days.  FOB appeared to be very supportive of MOB as evidence by wiping her tears and providing her with a back massage. CSW validated and normalized MOB's thoughts and feelings and processed ways MOB could incorporate sleep into her daily schedule. CSW asked about the family support team and MOB and FOB shared a wealth of family supporters.  MOB was happy to communicate that MOB's sister will be residing with MOB for the next few months.   CSW provided education regarding the baby blues period vs. perinatal mood disorders, discussed treatment and gave resources for mental health follow up if concerns arise.  CSW recommends self-evaluation during the postpartum time period using the New Mom Checklist from Postpartum Progress and encouraged MOB to contact a medical professional if symptoms are noted at any time. CSW assessed for safety and MOB denied SI, HI, and DV.   CSW identifies no further need for intervention and no barriers to discharge at this time.  Laurey Arrow, MSW, LCSW Clinical Social Work [phone]

## 2017-11-11 NOTE — Lactation Note (Addendum)
This note was copied from a baby's chart. Lactation Consultation Note Baby 62 hrs old. Parents feeling baby isn't satisfied although baby has good out put. Baby had at 12 hrs old 8 voids and 8 stools. Mom c/o breast aching. Stated nipples aching. Comfort gels given. Instructed not to use w/coconut oil.  Everted nipples positional stripe to Rt. Nipple, Lt. Nipple has abrasion. Mom wants to know when will the pain stop. Mom doesn't want to BF for a few days until breast heal. Mom asked if she can pump to give baby milk and baby drink from bottle. Discussed supply and demand. Gave shells to keep nipples from rubbing on clothing. Assessed baby's suck. Noted when crying has tight frenulum. W/gloved finger baby sucked on LC finger. VERY TIGHT!!! Clamping down, biting, has sharp ridged gums. Assessed gums, nothing unordinary noted. Understands why nipples would hurt. Fitted mom w/#20 NS. Latched baby in football hold. RN stated that mom hasn't been keeping baby cheek to breast and baby keep slipping to end on nipple. RN stated she has repeated instructing, assisting keeping baby close to breast. Mom also insist on keeping baby covered w/hats clothes and blankets. LC feels its cultural.  1st initial suck using NS hurt then much better mom stated. Mom stated she can tolerate that.   Noted mom's breast feeling tight. Has good everted nipples. Moms breast felt very round and even though tight, felt as if had implants. Asked mom if she had implants and she stated NO. FOB told her to tell the truth that I needed to know if she has implants or not. FOB asked what if she does would that have impact on BF. LC explained depends on how inserted. NO SCARS AT ALL NOTED> mom admitted that she did have implants. Mom stated the Dr. Who done it told her it wouldn't affect BF.  Mom has colostrum dripping. Baby BF to Lt. Breast. Getting mom to massage breast.   Mom shown how to use DEBP & how to disassemble, clean, & reassemble  parts. LC started pumping the Rt. Breast and massaging. Mom pumped 8 from Lt. Breast after baby finished BF for 40 min. Pumped 28 ml from Rt. Breast. Noted improvement.  LC constantly talking to mom and FOB answering questions. Mom ask questions frequently and repeats info back.  Asked mom if LC needed to call for interpreter and to let staff know if she didn't understand things. She stated she understood and husband explains again.   Discussed milk storage. explained pumping, engorgement, filling, transitional milk, mature milk, breast care, nipple care, positioning, breast massage, support bra, how to dry up milk,(mom wanted to know for when she stops in 6 months to 1 yr).  [place] asked RN to take #24 NS to see if comfortable when feeding. #20 was comfortable, looks snug. Parents cont. To have sooooo many questions.  Patient Name: Stephanie Figueroa [id number] Date: 11/11/2017 Reason for consult: Follow-up assessment;Mother's request;Nipple pain/trauma   Maternal Data    Feeding Feeding Type: Breast Fed Length of feed: 40 min  LATCH Score Latch: Grasps breast easily, tongue down, lips flanged, rhythmical sucking.  Audible Swallowing: Spontaneous and intermittent  Type of Nipple: Everted at rest and after stimulation  Comfort (Breast/Nipple): Engorged, cracked, bleeding, large blisters, severe discomfort  Hold (Positioning): Assistance needed to correctly position infant at breast and maintain latch.  LATCH Score: 7  Interventions Interventions: Breast feeding basics reviewed;Position options;Assisted with latch;Expressed milk;DEBP;Skin to skin;Breast compression;Coconut oil;Breast massage;Adjust position;Shells;Hand express;Support pillows;Comfort  gels  Lactation Tools Discussed/Used Tools: Pump;Coconut oil;Comfort gels;Nipple Jefferson Fuel;Shells Nipple shield size: 20 Shell Type: Inverted Breast pump type: Double-Electric Breast Pump Pump Review: Setup, frequency, and cleaning;Milk  Storage Initiated by:: RN Date initiated:: 11/10/17   Consult Status Consult Status: Follow-up Date: 11/11/17 Follow-up type: In-patient    Theodoro Kalata 11/11/2017, 5:28 AM

## 2017-11-11 NOTE — Discharge Instructions (Signed)

## 2017-11-16 ENCOUNTER — Ambulatory Visit (INDEPENDENT_AMBULATORY_CARE_PROVIDER_SITE_OTHER): Payer: 59 | Admitting: Obstetrics and Gynecology

## 2017-11-16 ENCOUNTER — Encounter: Payer: Self-pay | Admitting: Obstetrics and Gynecology

## 2017-11-16 VITALS — BP 105/58 | HR 78 | Resp 16 | Ht 62.0 in | Wt 128.0 lb

## 2017-11-16 DIAGNOSIS — Z9889 Other specified postprocedural states: Secondary | ICD-10-CM

## 2017-11-16 NOTE — Progress Notes (Signed)
37 yo G1P1 s/p elective primary cesarean section on 11/08/2017 here for incision check. Patient reports doing well. She is breastfeeding and her daughter is gaining weight. She denies any concerns of postpartum depression. She has ample of support at home from family members and husband. She states her pain is well controlled with ibuprofen.  Past Medical History:  Diagnosis Date  . Fibroma    Left breast  . Hyperthyroidism   . Thyroid disease    Past Surgical History:  Procedure Laterality Date  . CESAREAN SECTION N/A 11/08/2017   Procedure: CESAREAN SECTION;  Surgeon: Truett Mainland, DO;  Location: [facility];  Service: Obstetrics;  Laterality: N/A;  . PLACEMENT OF BREAST IMPLANTS     History reviewed. No pertinent family history. Social History   Tobacco Use  . Smoking status: Never Smoker  . Smokeless tobacco: Never Used  Substance Use Topics  . Alcohol use: No  . Drug use: No   ROS See pertinent in HPI Blood pressure (!) 105/58, pulse 78, resp. rate 16, height 5\' 2"  (1.575 m), weight 128 lb (58.1 kg), last menstrual period 01/31/2017, unknown if currently breastfeeding.  GENERAL: Well-developed, well-nourished female in no acute distress.  ABDOMEN: Soft, nontender, nondistended.  Incision: no erythema, induration and drainage. Healing well. Steri-strips removed PELVIC: not indicated EXTREMITIES: No cyanosis, clubbing, or edema, 2+ distal pulses.  A/P 37 yo POD # 8 s/p cesarean section here for incision check - Incision healing well. Advised to wash with soap and water as to remove all adhesive from skin and to keep incision clean and dry at all times. - follow up as scheduled in April for postpartum check - Reminded patient to come fasting at her next appointment for repeat GTT due to GDM

## 2017-12-21 ENCOUNTER — Ambulatory Visit: Payer: 59 | Admitting: Obstetrics & Gynecology

## 2018-01-04 ENCOUNTER — Encounter: Payer: Self-pay | Admitting: Obstetrics & Gynecology

## 2018-01-04 ENCOUNTER — Ambulatory Visit (INDEPENDENT_AMBULATORY_CARE_PROVIDER_SITE_OTHER): Payer: 59 | Admitting: Obstetrics & Gynecology

## 2018-01-04 NOTE — Progress Notes (Signed)
Post Partum Exam  Stephanie Figueroa is a 37 y.o. G76P1001 female who presents for a postpartum visit. She is 8 weeks postpartum following a low cervical transverse Cesarean section. I have fully reviewed the prenatal and intrapartum course. The delivery was at [redacted]w[redacted]d gestational weeks.  Anesthesia: epidural. Postpartum course has been unremarkable. Baby's course has been unremarkable. Baby is feeding by breast. Bleeding scant. Bowel function is normal. Bladder function is normal. Patient is sexually active. Contraception method is none. Postpartum depression screening:neg  The following portions of the patient's history were reviewed and updated as appropriate: allergies, current medications, past family history, past medical history, past social history, past surgical history and problem list. Last pap smear done 9/18 and was Normal  Review of Systems Pertinent items are noted in HPI.    Objective:  Last menstrual period 01/31/2017, unknown if currently breastfeeding.  General:  alert   Breasts:  inspection negative, no nipple discharge or bleeding, no masses or nodularity palpable  Lungs: clear to auscultation bilaterally  Heart:  regular rate and rhythm, S1, S2 normal, no murmur, click, rub or gallop  Abdomen: soft, non-tender; bowel sounds normal; no masses,  no organomegaly, scar healing well   Vulva:  normal  Vagina: normal vagina  Cervix:  anteverted  Corpus: not examined  Adnexa:  not evaluated  Rectal Exam: Not performed.        Assessment:    Normal postpartum exam. Pap smear not done at today's visit.   Plan:    Contraception: condoms
